# Patient Record
Sex: Female | Born: 1960 | Race: White | Hispanic: No | Marital: Married | Smoking: Former smoker
Health system: Southern US, Community
[De-identification: ages and names within clinical notes are randomized; demographics above are authoritative.]

## PROBLEM LIST (undated history)

## (undated) HISTORY — PX: BREAST CYST ASPIRATION: SHX578

---

## 2004-09-29 ENCOUNTER — Ambulatory Visit: Payer: Self-pay | Admitting: Family Medicine

## 2004-11-17 ENCOUNTER — Ambulatory Visit: Payer: Self-pay | Admitting: Internal Medicine

## 2004-11-30 ENCOUNTER — Ambulatory Visit: Payer: Self-pay | Admitting: Internal Medicine

## 2005-08-03 ENCOUNTER — Ambulatory Visit: Payer: Self-pay | Admitting: Unknown Physician Specialty

## 2006-06-22 ENCOUNTER — Ambulatory Visit: Payer: Self-pay | Admitting: Unknown Physician Specialty

## 2007-04-24 ENCOUNTER — Ambulatory Visit: Payer: Self-pay | Admitting: Family Medicine

## 2008-10-14 ENCOUNTER — Ambulatory Visit: Payer: Self-pay | Admitting: Family Medicine

## 2009-11-25 ENCOUNTER — Ambulatory Visit: Payer: Self-pay | Admitting: Family Medicine

## 2010-02-10 ENCOUNTER — Emergency Department: Payer: Self-pay | Admitting: Emergency Medicine

## 2011-12-06 ENCOUNTER — Ambulatory Visit: Payer: Self-pay | Admitting: Obstetrics & Gynecology

## 2013-09-23 ENCOUNTER — Emergency Department: Payer: Self-pay | Admitting: Emergency Medicine

## 2013-09-23 LAB — BASIC METABOLIC PANEL
BUN: 19 mg/dL — ABNORMAL HIGH (ref 7–18)
Calcium, Total: 9.8 mg/dL (ref 8.5–10.1)
Co2: 32 mmol/L (ref 21–32)
Creatinine: 0.84 mg/dL (ref 0.60–1.30)
EGFR (Non-African Amer.): >60
Osmolality: 282 (ref 275–301)
Sodium: 140 mmol/L (ref 136–145)

## 2013-09-23 LAB — TROPONIN I: Troponin-I: <0.02 ng/mL

## 2013-09-23 LAB — CBC
HCT: 40.2 % (ref 35.0–47.0)
Platelet: 227 10*3/uL (ref 150–440)
RDW: 12.9 % (ref 11.5–14.5)
WBC: 7 10*3/uL (ref 3.6–11.0)

## 2014-07-07 DIAGNOSIS — R002 Palpitations: Secondary | ICD-10-CM | POA: Insufficient documentation

## 2014-11-10 ENCOUNTER — Ambulatory Visit: Payer: Self-pay | Admitting: Physician Assistant

## 2014-11-24 ENCOUNTER — Ambulatory Visit: Payer: Self-pay | Admitting: Physician Assistant

## 2015-01-27 ENCOUNTER — Ambulatory Visit
Admit: 2015-01-27 | Disposition: A | Discharge status: Home / Self Care | Payer: Self-pay | Attending: General Practice | Admitting: General Practice

## 2015-03-02 DIAGNOSIS — E785 Hyperlipidemia, unspecified: Secondary | ICD-10-CM | POA: Insufficient documentation

## 2015-03-02 DIAGNOSIS — I10 Essential (primary) hypertension: Secondary | ICD-10-CM | POA: Insufficient documentation

## 2015-08-19 ENCOUNTER — Other Ambulatory Visit: Payer: Self-pay | Admitting: Physician Assistant

## 2015-09-18 ENCOUNTER — Other Ambulatory Visit: Payer: Self-pay | Admitting: Physician Assistant

## 2015-10-07 ENCOUNTER — Encounter: Payer: Self-pay | Admitting: Physician Assistant

## 2015-10-07 ENCOUNTER — Ambulatory Visit: Payer: Self-pay | Admitting: Physician Assistant

## 2015-10-07 VITALS — BP 120/70 | HR 89 | Temp 97.8°F

## 2015-10-07 DIAGNOSIS — J018 Other acute sinusitis: Secondary | ICD-10-CM

## 2015-10-07 MED ORDER — AMOXICILLIN 875 MG PO TABS: 875.0000 mg | ORAL_TABLET | Freq: Two times a day (BID) | ORAL | Status: DC

## 2015-10-07 MED ORDER — FLUCONAZOLE 150 MG PO TABS: ORAL_TABLET | ORAL | Status: DC

## 2015-10-07 NOTE — Progress Notes (Signed)
S: C/o runny nose and congestion for 3 days, no fever, chills, cp/sob, v/d; mucus is green and thick, cough is sporadic, c/o of facial and dental pain. Husband has same sx  Using otc meds:   O: PE: vitals wnl, nad,  perrl eomi, normocephalic, tms dull, nasal mucosa red and swollen, throat injected, neck supple no lymph, lungs c t a, cv rrr, neuro intact  A:  Acute sinusitis   P: amoxil 875mg  bid x 10d, diflucan, drink fluids, continue regular meds , use otc meds of choice, return if not improving in 5 days, return earlier if worsening

## 2015-10-15 ENCOUNTER — Other Ambulatory Visit: Payer: Self-pay | Admitting: Emergency Medicine

## 2015-10-15 MED ORDER — OLMESARTAN MEDOXOMIL 20 MG PO TABS: 20.0000 mg | ORAL_TABLET | Freq: Every day | ORAL | Status: DC

## 2015-10-15 NOTE — Telephone Encounter (Signed)
Received a faxed medication request from [facility].  Please advise.  Thank you. 

## 2015-10-15 NOTE — Telephone Encounter (Signed)
Med refill approved 

## 2015-12-18 ENCOUNTER — Other Ambulatory Visit: Payer: Self-pay | Admitting: Physician Assistant

## 2016-02-18 ENCOUNTER — Other Ambulatory Visit: Payer: Self-pay | Admitting: Family

## 2016-02-18 NOTE — Telephone Encounter (Signed)
Med refill approved x 1, pt needs yearly labs prior to next refill

## 2016-03-17 ENCOUNTER — Other Ambulatory Visit: Payer: Self-pay | Admitting: Physician Assistant

## 2016-03-21 NOTE — Telephone Encounter (Signed)
Med refill approved, pt needs to be seen for fasting labs, please call and let her know to come in

## 2016-03-25 ENCOUNTER — Other Ambulatory Visit: Payer: Self-pay

## 2016-03-25 DIAGNOSIS — Z299 Encounter for prophylactic measures, unspecified: Secondary | ICD-10-CM

## 2016-03-25 NOTE — Progress Notes (Signed)
[facility].  Patient was told to get labs done before she can have any additional refills for her medications by [facility].  Blood was drawn from the right arm without any incident.

## 2016-03-26 LAB — CMP12+LP+TP+TSH+6AC+CBC/D/PLT
A/G RATIO: 2 (ref 1.2–2.2)
ALT: 31 IU/L (ref 0–32)
AST: 10 IU/L (ref 0–40)
Albumin: 4.6 g/dL (ref 3.5–5.5)
Alkaline Phosphatase: 74 IU/L (ref 39–117)
BASOS ABS: 0 10*3/uL (ref 0.0–0.2)
BILIRUBIN TOTAL: 0.4 mg/dL (ref 0.0–1.2)
BUN/Creatinine Ratio: 26 — ABNORMAL HIGH (ref 9–23)
BUN: 17 mg/dL (ref 6–24)
Basos: 0 %
CALCIUM: 9.4 mg/dL (ref 8.7–10.2)
CHLORIDE: 100 mmol/L (ref 96–106)
CHOL/HDL RATIO: 3.5 ratio (ref 0.0–4.4)
CREATININE: 0.65 mg/dL (ref 0.57–1.00)
Cholesterol, Total: 223 mg/dL — ABNORMAL HIGH (ref 100–199)
EOS (ABSOLUTE): 0.1 10*3/uL (ref 0.0–0.4)
EOS: 2 %
Estimated CHD Risk: 0.6 times avg. (ref 0.0–1.0)
Free Thyroxine Index: 2.2 (ref 1.2–4.9)
GFR, EST AFRICAN AMERICAN: 116 mL/min/{1.73_m2} (ref >59)
GFR, EST NON AFRICAN AMERICAN: 100 mL/min/{1.73_m2} (ref >59)
GGT: 30 IU/L (ref 0–60)
GLUCOSE: 93 mg/dL (ref 65–99)
Globulin, Total: 2.3 g/dL (ref 1.5–4.5)
HDL: 64 mg/dL (ref >39)
HEMATOCRIT: 40.2 % (ref 34.0–46.6)
HEMOGLOBIN: 13 g/dL (ref 11.1–15.9)
Immature Grans (Abs): 0 10*3/uL (ref 0.0–0.1)
Immature Granulocytes: 0 %
Iron: 92 ug/dL (ref 27–159)
LDH: 152 IU/L (ref 119–226)
LDL CALC: 128 mg/dL — AB (ref 0–99)
Lymphocytes Absolute: 2.1 10*3/uL (ref 0.7–3.1)
Lymphs: 33 %
MCH: 29.8 pg (ref 26.6–33.0)
MCHC: 32.3 g/dL (ref 31.5–35.7)
MCV: 92 fL (ref 79–97)
MONOCYTES: 10 %
Monocytes Absolute: 0.6 10*3/uL (ref 0.1–0.9)
Neutrophils Absolute: 3.4 10*3/uL (ref 1.4–7.0)
Neutrophils: 55 %
PLATELETS: 246 10*3/uL (ref 150–379)
Phosphorus: 4.5 mg/dL (ref 2.5–4.5)
Potassium: 5.2 mmol/L (ref 3.5–5.2)
RBC: 4.36 x10E6/uL (ref 3.77–5.28)
RDW: 12.9 % (ref 12.3–15.4)
Sodium: 142 mmol/L (ref 134–144)
T3 Uptake Ratio: 27 % (ref 24–39)
T4, Total: 8.1 ug/dL (ref 4.5–12.0)
TSH: 1.58 u[IU]/mL (ref 0.450–4.500)
Total Protein: 6.9 g/dL (ref 6.0–8.5)
Triglycerides: 155 mg/dL — ABNORMAL HIGH (ref 0–149)
URIC ACID: 6.7 mg/dL (ref 2.5–7.1)
VLDL CHOLESTEROL CAL: 31 mg/dL (ref 5–40)
WBC: 6.3 10*3/uL (ref 3.4–10.8)

## 2016-03-26 LAB — VITAMIN D 25 HYDROXY (VIT D DEFICIENCY, FRACTURES): VIT D 25 HYDROXY: 34 ng/mL (ref 30.0–100.0)

## 2016-09-21 ENCOUNTER — Encounter: Payer: Self-pay | Admitting: Physician Assistant

## 2016-09-21 ENCOUNTER — Ambulatory Visit: Payer: Self-pay | Admitting: Physician Assistant

## 2016-09-21 VITALS — BP 140/80 | HR 100 | Temp 98.5°F

## 2016-09-21 DIAGNOSIS — F5102 Adjustment insomnia: Secondary | ICD-10-CM

## 2016-09-21 MED ORDER — TRAZODONE HCL 150 MG PO TABS: 150.0000 mg | ORAL_TABLET | Freq: Every evening | ORAL | 3 refills | Status: DC | PRN

## 2016-09-21 NOTE — Addendum Note (Signed)
Addended by: Catha BrowEACON, MONIQUE T on: 09/21/2016 02:09 PM   Modules accepted: Orders

## 2016-09-21 NOTE — Addendum Note (Signed)
Addended by: Catha BrowEACON, MONIQUE T on: 09/21/2016 02:03 PM   Modules accepted: Orders

## 2016-09-21 NOTE — Progress Notes (Signed)
S: c/o insomnia, recently lost her job and has had other stressors at home, never been a "good sleeper" but now isn't sleeping at all, some depression but not bad, no si/hi  O: vitals wnl, nad, pt appears tired, has circles around her eyes, lungs c t a, cv rrr  A: insomnia  P: trazadone 150mg  qhs

## 2016-09-26 ENCOUNTER — Other Ambulatory Visit: Payer: Self-pay | Admitting: Physician Assistant

## 2016-09-26 DIAGNOSIS — Z1231 Encounter for screening mammogram for malignant neoplasm of breast: Secondary | ICD-10-CM

## 2016-10-11 ENCOUNTER — Encounter: Payer: Self-pay | Admitting: Physician Assistant

## 2016-10-11 ENCOUNTER — Ambulatory Visit: Payer: Self-pay | Admitting: Physician Assistant

## 2016-10-11 VITALS — BP 166/90 | HR 105 | Temp 98.8°F

## 2016-10-11 DIAGNOSIS — J0100 Acute maxillary sinusitis, unspecified: Secondary | ICD-10-CM

## 2016-10-11 MED ORDER — FLUCONAZOLE 150 MG PO TABS: ORAL_TABLET | ORAL | 0 refills | Status: DC

## 2016-10-11 MED ORDER — AMOXICILLIN 875 MG PO TABS: 875.0000 mg | ORAL_TABLET | Freq: Two times a day (BID) | ORAL | 0 refills | Status: DC

## 2016-10-11 NOTE — Progress Notes (Signed)
S: C/o runny nose and congestion for 3 days, no fever, chills, cp/sob, v/d; mucus is green and thick, cough is sporadic, c/o of facial and dental pain.   Using otc meds:   O: PE: vitals wnl nad, perrl eomi, normocephalic, tms dull, nasal mucosa red and swollen, throat injected, neck supple no lymph, lungs c t a, cv rrr, neuro intact  A:  Acute sinusitis   P: drink fluids, continue regular meds , use otc meds of choice, return if not improving in 5 days, return earlier if worsening , amoxil, diflucan if needed

## 2016-10-27 ENCOUNTER — Ambulatory Visit: Payer: Self-pay | Admitting: Physician Assistant

## 2016-10-27 ENCOUNTER — Encounter: Payer: Self-pay | Admitting: Physician Assistant

## 2016-10-27 VITALS — BP 139/70 | HR 91 | Temp 98.6°F

## 2016-10-27 DIAGNOSIS — J0100 Acute maxillary sinusitis, unspecified: Secondary | ICD-10-CM

## 2016-10-27 MED ORDER — CEFDINIR 300 MG PO CAPS: 300.0000 mg | ORAL_CAPSULE | Freq: Two times a day (BID) | ORAL | 0 refills | Status: DC

## 2016-10-27 NOTE — Progress Notes (Signed)
c 

## 2016-10-27 NOTE — Progress Notes (Signed)
S: C/o runny nose and congestion for 3-5 days, no fever, chills, cp/sob, v/d; mucus is green and thick, cough is sporadic, c/o of facial and dental pain. Only took 1/2 of the amoxil and thinks the sx are getting worse  Using otc meds:   O: PE: vitals wnl, nad, perrl eomi, normocephalic, tms dull, nasal mucosa red and swollen, throat injected, neck supple no lymph, lungs c t a, cv rrr, neuro intact  A:  Acute sinusitis   P: drink fluids, continue regular meds , use otc meds of choice, return if not improving in 5 days, return earlier if worsening , omnicef

## 2016-11-01 ENCOUNTER — Encounter: Payer: Self-pay | Admitting: Radiology

## 2016-11-01 ENCOUNTER — Ambulatory Visit
Admission: RE | Admit: 2016-11-01 | Discharge: 2016-11-01 | Disposition: A | Discharge status: Home / Self Care | Payer: Managed Care, Other (non HMO) | Source: Ambulatory Visit | Attending: Physician Assistant | Admitting: Physician Assistant

## 2016-11-01 DIAGNOSIS — Z1231 Encounter for screening mammogram for malignant neoplasm of breast: Secondary | ICD-10-CM | POA: Insufficient documentation

## 2016-11-01 DIAGNOSIS — R928 Other abnormal and inconclusive findings on diagnostic imaging of breast: Secondary | ICD-10-CM | POA: Insufficient documentation

## 2016-11-04 ENCOUNTER — Other Ambulatory Visit: Payer: Self-pay | Admitting: Physician Assistant

## 2016-11-04 DIAGNOSIS — R928 Other abnormal and inconclusive findings on diagnostic imaging of breast: Secondary | ICD-10-CM

## 2016-11-08 ENCOUNTER — Other Ambulatory Visit: Payer: Self-pay | Admitting: Physician Assistant

## 2016-11-09 ENCOUNTER — Other Ambulatory Visit: Payer: Self-pay | Admitting: Physician Assistant

## 2016-11-09 NOTE — Telephone Encounter (Signed)
Med refill for synthroid approved 

## 2016-11-09 NOTE — Telephone Encounter (Signed)
Med refill for benicar approved 

## 2016-11-15 ENCOUNTER — Ambulatory Visit
Admission: RE | Admit: 2016-11-15 | Discharge: 2016-11-15 | Disposition: A | Discharge status: Home / Self Care | Payer: Managed Care, Other (non HMO) | Source: Ambulatory Visit | Attending: Physician Assistant | Admitting: Physician Assistant

## 2016-11-15 DIAGNOSIS — N6320 Unspecified lump in the left breast, unspecified quadrant: Secondary | ICD-10-CM | POA: Insufficient documentation

## 2016-11-15 DIAGNOSIS — R928 Other abnormal and inconclusive findings on diagnostic imaging of breast: Secondary | ICD-10-CM

## 2016-11-16 ENCOUNTER — Other Ambulatory Visit: Payer: Self-pay | Admitting: Physician Assistant

## 2016-11-16 DIAGNOSIS — R928 Other abnormal and inconclusive findings on diagnostic imaging of breast: Secondary | ICD-10-CM

## 2016-11-16 DIAGNOSIS — N6320 Unspecified lump in the left breast, unspecified quadrant: Secondary | ICD-10-CM

## 2016-11-29 ENCOUNTER — Ambulatory Visit
Admission: RE | Admit: 2016-11-29 | Discharge: 2016-11-29 | Disposition: A | Discharge status: Home / Self Care | Payer: Managed Care, Other (non HMO) | Source: Ambulatory Visit | Attending: Physician Assistant | Admitting: Physician Assistant

## 2016-11-29 DIAGNOSIS — N6320 Unspecified lump in the left breast, unspecified quadrant: Secondary | ICD-10-CM | POA: Diagnosis present

## 2016-11-29 DIAGNOSIS — N6032 Fibrosclerosis of left breast: Secondary | ICD-10-CM | POA: Insufficient documentation

## 2016-11-29 DIAGNOSIS — R928 Other abnormal and inconclusive findings on diagnostic imaging of breast: Secondary | ICD-10-CM

## 2016-11-29 DIAGNOSIS — N6322 Unspecified lump in the left breast, upper inner quadrant: Secondary | ICD-10-CM | POA: Insufficient documentation

## 2016-11-29 HISTORY — PX: BREAST BIOPSY: SHX20

## 2016-11-30 LAB — SURGICAL PATHOLOGY

## 2017-02-07 ENCOUNTER — Other Ambulatory Visit: Payer: Self-pay | Admitting: Physician Assistant

## 2017-02-07 NOTE — Telephone Encounter (Signed)
Med refill for trazadone approved, using for insomnia assoc with anxiety due to recent job loss

## 2017-03-01 ENCOUNTER — Ambulatory Visit: Payer: Self-pay | Admitting: Physician Assistant

## 2017-03-01 ENCOUNTER — Encounter: Payer: Self-pay | Admitting: Physician Assistant

## 2017-03-01 VITALS — BP 100/70 | HR 80 | Temp 98.5°F

## 2017-03-01 DIAGNOSIS — J069 Acute upper respiratory infection, unspecified: Secondary | ICD-10-CM

## 2017-03-01 MED ORDER — FLUCONAZOLE 150 MG PO TABS: ORAL_TABLET | ORAL | 0 refills | Status: DC

## 2017-03-01 MED ORDER — AMOXICILLIN 875 MG PO TABS: 875.0000 mg | ORAL_TABLET | Freq: Two times a day (BID) | ORAL | 0 refills | Status: DC

## 2017-03-01 NOTE — Progress Notes (Signed)
S: C/o cough and  congestion for 14 days, no fever, chills, cp/sob, v/d; mucus was green this am but clear throughout the day, cough is sporadic, chest is rattling and feels tight  Using otc meds: mucinex, used 2 boxes  O: PE: vitals wnl, nad, perrl eomi, normocephalic, tms dull, nasal mucosa red and swollen, throat injected, neck supple no lymph, lungs c t a, cv rrr, neuro intact  A:  Acute uri   P: drink fluids, continue regular meds , use otc meds of choice, return if not improving in 5 days, return earlier if worsening , amoxil, diflucan

## 2017-05-01 ENCOUNTER — Ambulatory Visit: Payer: Self-pay | Admitting: Physician Assistant

## 2017-05-01 VITALS — BP 125/70 | HR 93 | Temp 98.5°F | Resp 16

## 2017-05-01 DIAGNOSIS — R05 Cough: Secondary | ICD-10-CM

## 2017-05-01 DIAGNOSIS — R059 Cough, unspecified: Secondary | ICD-10-CM

## 2017-05-01 NOTE — Progress Notes (Signed)
Pt needed to leave to get back to work, will make appt for later this week

## 2017-05-08 ENCOUNTER — Other Ambulatory Visit: Payer: Self-pay | Admitting: Physician Assistant

## 2017-05-17 ENCOUNTER — Ambulatory Visit: Payer: Self-pay | Admitting: Physician Assistant

## 2017-05-17 VITALS — BP 120/70 | HR 78 | Temp 98.5°F | Resp 16

## 2017-05-17 DIAGNOSIS — R5383 Other fatigue: Secondary | ICD-10-CM

## 2017-05-17 NOTE — Progress Notes (Signed)
S:  Fatigue x 2 months.  Has been taking synthroid for 11 years at the same dose.  No other changes.  Denies night sweats.  + difficulty getting to sleep.  O:  Heart RRR, Lung clr bilat, A:  Fatigue P:  Labs drawn

## 2017-05-17 NOTE — Addendum Note (Signed)
Addended by: Catha BrowEACON, MONIQUE T on: 05/17/2017 11:03 AM   Modules accepted: Orders

## 2017-05-18 LAB — CMP12+LP+TP+TSH+6AC+CBC/D/PLT
ALT: 23 IU/L (ref 0–32)
AST: 8 IU/L (ref 0–40)
Albumin/Globulin Ratio: 2.4 — ABNORMAL HIGH (ref 1.2–2.2)
Albumin: 4.6 g/dL (ref 3.5–5.5)
Alkaline Phosphatase: 74 IU/L (ref 39–117)
BASOS ABS: 0 10*3/uL (ref 0.0–0.2)
BASOS: 1 %
BUN / CREAT RATIO: 19 (ref 9–23)
BUN: 15 mg/dL (ref 6–24)
Bilirubin Total: 0.4 mg/dL (ref 0.0–1.2)
CHLORIDE: 100 mmol/L (ref 96–106)
Calcium: 9.6 mg/dL (ref 8.7–10.2)
Chol/HDL Ratio: 3.8 ratio (ref 0.0–4.4)
Cholesterol, Total: 203 mg/dL — ABNORMAL HIGH (ref 100–199)
Creatinine, Ser: 0.8 mg/dL (ref 0.57–1.00)
EOS (ABSOLUTE): 0.1 10*3/uL (ref 0.0–0.4)
EOS: 2 %
ESTIMATED CHD RISK: 0.8 times avg. (ref 0.0–1.0)
Free Thyroxine Index: 2 (ref 1.2–4.9)
GFR calc Af Amer: 95 mL/min/{1.73_m2} (ref >59)
GFR calc non Af Amer: 83 mL/min/{1.73_m2} (ref >59)
GGT: 29 IU/L (ref 0–60)
GLUCOSE: 94 mg/dL (ref 65–99)
Globulin, Total: 1.9 g/dL (ref 1.5–4.5)
HDL: 53 mg/dL (ref >39)
HEMOGLOBIN: 12.9 g/dL (ref 11.1–15.9)
Hematocrit: 39.6 % (ref 34.0–46.6)
IRON: 87 ug/dL (ref 27–159)
Immature Grans (Abs): 0 10*3/uL (ref 0.0–0.1)
Immature Granulocytes: 0 %
LDH: 172 IU/L (ref 119–226)
LDL Calculated: 117 mg/dL — ABNORMAL HIGH (ref 0–99)
LYMPHS ABS: 2.1 10*3/uL (ref 0.7–3.1)
Lymphs: 32 %
MCH: 29.7 pg (ref 26.6–33.0)
MCHC: 32.6 g/dL (ref 31.5–35.7)
MCV: 91 fL (ref 79–97)
Monocytes Absolute: 0.5 10*3/uL (ref 0.1–0.9)
Monocytes: 8 %
Neutrophils Absolute: 3.7 10*3/uL (ref 1.4–7.0)
Neutrophils: 57 %
PHOSPHORUS: 4.3 mg/dL (ref 2.5–4.5)
PLATELETS: 275 10*3/uL (ref 150–379)
POTASSIUM: 4.6 mmol/L (ref 3.5–5.2)
RBC: 4.35 x10E6/uL (ref 3.77–5.28)
RDW: 13.5 % (ref 12.3–15.4)
SODIUM: 141 mmol/L (ref 134–144)
T3 UPTAKE RATIO: 24 % (ref 24–39)
T4, Total: 8.5 ug/dL (ref 4.5–12.0)
TOTAL PROTEIN: 6.5 g/dL (ref 6.0–8.5)
TSH: 3.86 u[IU]/mL (ref 0.450–4.500)
Triglycerides: 164 mg/dL — ABNORMAL HIGH (ref 0–149)
URIC ACID: 5.6 mg/dL (ref 2.5–7.1)
VLDL CHOLESTEROL CAL: 33 mg/dL (ref 5–40)
WBC: 6.5 10*3/uL (ref 3.4–10.8)

## 2017-05-18 LAB — VITAMIN D 25 HYDROXY (VIT D DEFICIENCY, FRACTURES): Vit D, 25-Hydroxy: 33 ng/mL (ref 30.0–100.0)

## 2017-05-18 LAB — VITAMIN B12: Vitamin B-12: 1215 pg/mL (ref 232–1245)

## 2017-06-28 ENCOUNTER — Ambulatory Visit: Payer: Self-pay | Admitting: Physician Assistant

## 2017-06-28 ENCOUNTER — Encounter: Payer: Self-pay | Admitting: Physician Assistant

## 2017-06-28 VITALS — BP 110/70 | HR 83 | Temp 98.5°F | Resp 16

## 2017-06-28 DIAGNOSIS — N63 Unspecified lump in unspecified breast: Secondary | ICD-10-CM

## 2017-06-28 DIAGNOSIS — R05 Cough: Secondary | ICD-10-CM

## 2017-06-28 DIAGNOSIS — R059 Cough, unspecified: Secondary | ICD-10-CM

## 2017-06-28 MED ORDER — LEVOCETIRIZINE DIHYDROCHLORIDE 5 MG PO TABS: 5.0000 mg | ORAL_TABLET | Freq: Every evening | ORAL | 12 refills | Status: DC

## 2017-06-28 MED ORDER — OMEPRAZOLE 20 MG PO CPDR: 20.0000 mg | DELAYED_RELEASE_CAPSULE | Freq: Every day | ORAL | 3 refills | Status: DC

## 2017-06-28 NOTE — Progress Notes (Signed)
S: c/o cough for over a year, ?if from bp meds as she started taking the generic, no fever/chills/or sputum production, denies cp/sob, states the cough is worse at night when she lies down, does have some allergies, the cough keeps waking her up at night, also needs order for mammogram to recheck left breast  O: vitals wnl, nad, ent wnl, neck supple no lymph, lungs c t a, cv rrr  A: chronic cough, ?gerd, breast mass   P: cxr, mammogram left breast, omeprazole, xyzal

## 2017-06-29 ENCOUNTER — Ambulatory Visit
Admission: RE | Admit: 2017-06-29 | Discharge: 2017-06-29 | Disposition: A | Discharge status: Home / Self Care | Payer: Managed Care, Other (non HMO) | Source: Ambulatory Visit | Attending: Physician Assistant | Admitting: Physician Assistant

## 2017-06-29 DIAGNOSIS — R05 Cough: Secondary | ICD-10-CM | POA: Insufficient documentation

## 2017-06-29 DIAGNOSIS — R059 Cough, unspecified: Secondary | ICD-10-CM

## 2017-07-06 NOTE — Addendum Note (Signed)
Addended by: Catha Brow T on: 07/06/2017 04:14 PM   Modules accepted: Orders

## 2017-10-12 ENCOUNTER — Ambulatory Visit
Admission: RE | Admit: 2017-10-12 | Discharge: 2017-10-12 | Disposition: A | Discharge status: Home / Self Care | Payer: Managed Care, Other (non HMO) | Source: Ambulatory Visit | Attending: Physician Assistant | Admitting: Physician Assistant

## 2017-10-12 DIAGNOSIS — N63 Unspecified lump in unspecified breast: Secondary | ICD-10-CM

## 2017-10-12 DIAGNOSIS — N6321 Unspecified lump in the left breast, upper outer quadrant: Secondary | ICD-10-CM | POA: Insufficient documentation

## 2017-10-12 DIAGNOSIS — N6489 Other specified disorders of breast: Secondary | ICD-10-CM | POA: Diagnosis not present

## 2017-10-12 DIAGNOSIS — N6320 Unspecified lump in the left breast, unspecified quadrant: Secondary | ICD-10-CM | POA: Diagnosis present

## 2017-10-18 ENCOUNTER — Ambulatory Visit: Payer: Self-pay | Admitting: Emergency Medicine

## 2017-10-18 VITALS — BP 120/80 | HR 94 | Temp 97.7°F | Resp 16

## 2017-10-18 DIAGNOSIS — R05 Cough: Secondary | ICD-10-CM

## 2017-10-18 DIAGNOSIS — R059 Cough, unspecified: Secondary | ICD-10-CM

## 2017-10-18 DIAGNOSIS — J0100 Acute maxillary sinusitis, unspecified: Secondary | ICD-10-CM

## 2017-10-18 MED ORDER — FLUTICASONE PROPIONATE 50 MCG/ACT NA SUSP: 2.0000 | Freq: Every day | NASAL | 6 refills | Status: DC

## 2017-10-18 MED ORDER — FLUCONAZOLE 150 MG PO TABS: 150.0000 mg | ORAL_TABLET | Freq: Once | ORAL | 0 refills | Status: AC

## 2017-10-18 MED ORDER — BENZONATATE 100 MG PO CAPS: 100.0000 mg | ORAL_CAPSULE | Freq: Three times a day (TID) | ORAL | 0 refills | Status: DC | PRN

## 2017-10-18 MED ORDER — AMOXICILLIN 875 MG PO TABS: 875.0000 mg | ORAL_TABLET | Freq: Two times a day (BID) | ORAL | 0 refills | Status: DC

## 2017-10-18 NOTE — Progress Notes (Signed)
Subjective. Patient presents with onset 8 days ago of head congestion sore throat. This was followed by a cough. [facility]. She has had a persistent cough. Her main problem is facial pressure associated with a thick discolored nasal drainage. She feels full in her sinus area. She has not had fevers chills or myalgias. Objective. Alert and cooperative not in any distress. TMs are normal without fluid. Nose exam reveals significant nasal congestion. Throat is red without exudate. Chest is clear to auscultation Assessment  Patient seen with upper respiratory infection with secondary maxillary sinusitis. Plan #1 amoxicillin 875 twice a day. #2 Tessalon Perles 100 mg 1-23 times a day. #3 Flonase nasal spray. #4 Diflucan 150 one dose.

## 2018-02-09 ENCOUNTER — Other Ambulatory Visit: Payer: Self-pay | Admitting: Family Medicine

## 2018-02-09 DIAGNOSIS — I10 Essential (primary) hypertension: Secondary | ICD-10-CM

## 2018-02-09 DIAGNOSIS — K219 Gastro-esophageal reflux disease without esophagitis: Secondary | ICD-10-CM

## 2018-02-09 DIAGNOSIS — E039 Hypothyroidism, unspecified: Secondary | ICD-10-CM

## 2018-02-09 MED ORDER — OMEPRAZOLE 20 MG PO CPDR: 20.0000 mg | DELAYED_RELEASE_CAPSULE | Freq: Every day | ORAL | 0 refills | Status: AC

## 2018-02-09 MED ORDER — SYNTHROID 112 MCG PO TABS: 112.0000 ug | ORAL_TABLET | Freq: Every day | ORAL | 0 refills | Status: AC

## 2018-02-09 MED ORDER — OLMESARTAN MEDOXOMIL 20 MG PO TABS: ORAL_TABLET | ORAL | 0 refills | Status: AC

## 2018-02-09 NOTE — Progress Notes (Signed)
Patient called requesting a refill for olmesartan, omeprazole, and levothyroxine.  Patient was previously getting refills from our clinic for these.  Patient was advised that I would provide her with one refill for these medications to provide her with  enough next time to establish care with a primary care provider.  Patient was advised that her primary care provider would be responsible for monitoring and refills in the future.

## 2018-02-15 ENCOUNTER — Ambulatory Visit: Payer: Self-pay

## 2018-02-19 DIAGNOSIS — E039 Hypothyroidism, unspecified: Secondary | ICD-10-CM | POA: Insufficient documentation

## 2018-04-02 IMAGING — US US BREAST*L* LIMITED INC AXILLA
1 series · 7 of 7 positions shown · non-contrast
Comparison: Previous exam(s).

CLINICAL DATA: Patient recalled from screening for possible left
breast distortion.

EXAM:
DIGITAL DIAGNOSTIC LEFT MAMMOGRAM WITH CAD
ULTRASOUND LEFT BREAST

[Series 1: us breast*left* limited inc axilla · 0.06mm/px · 7 of 7 slices shown]
[im 1/7]
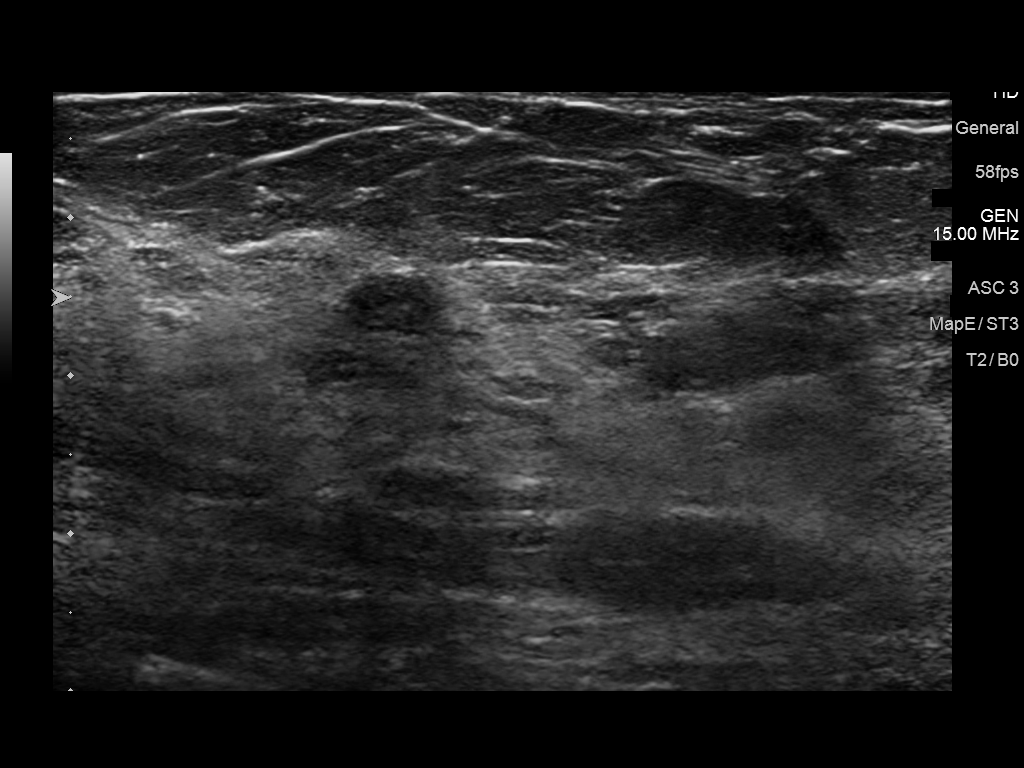
[im 2/7]
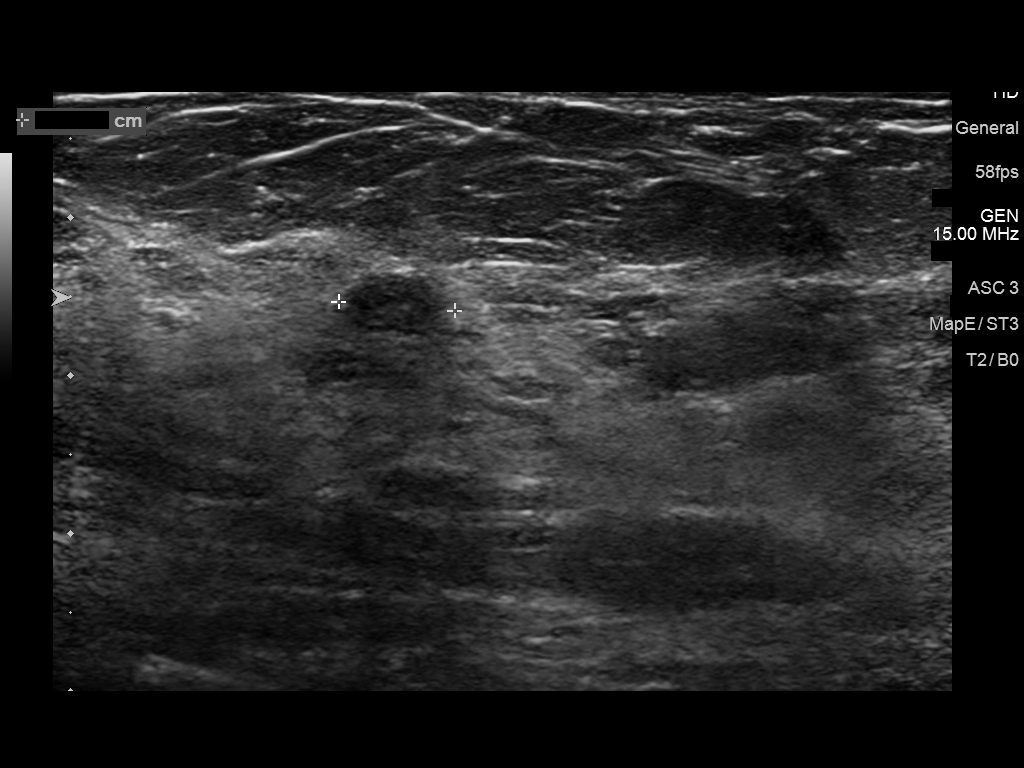
[im 3/7]
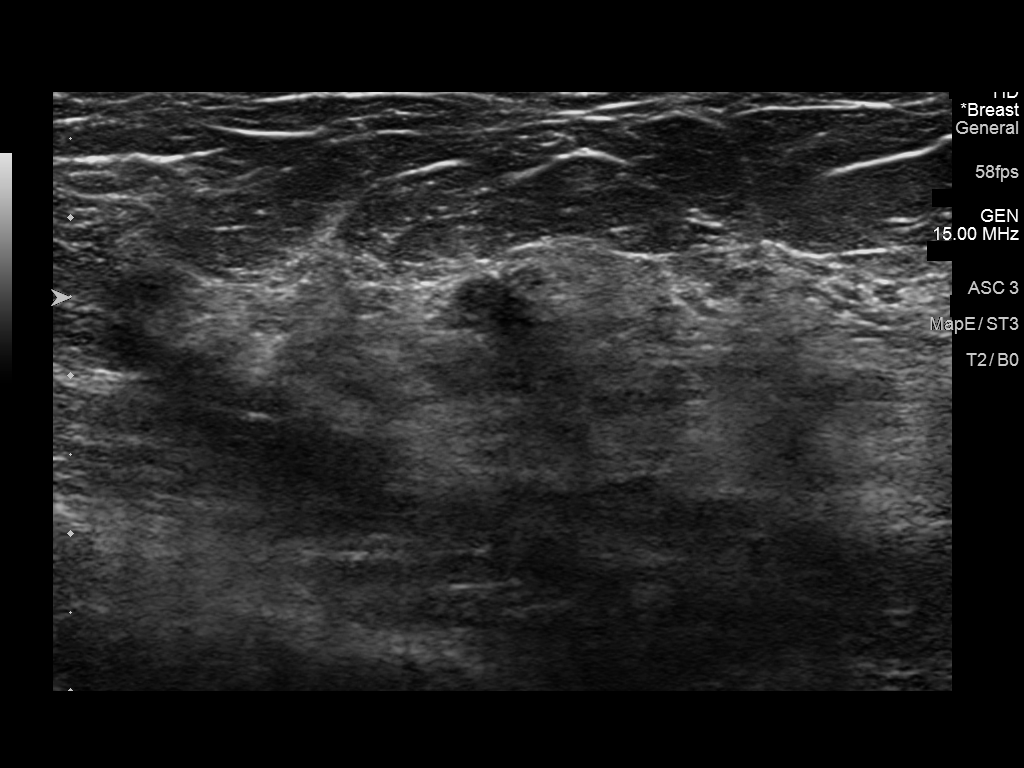
[im 4/7]
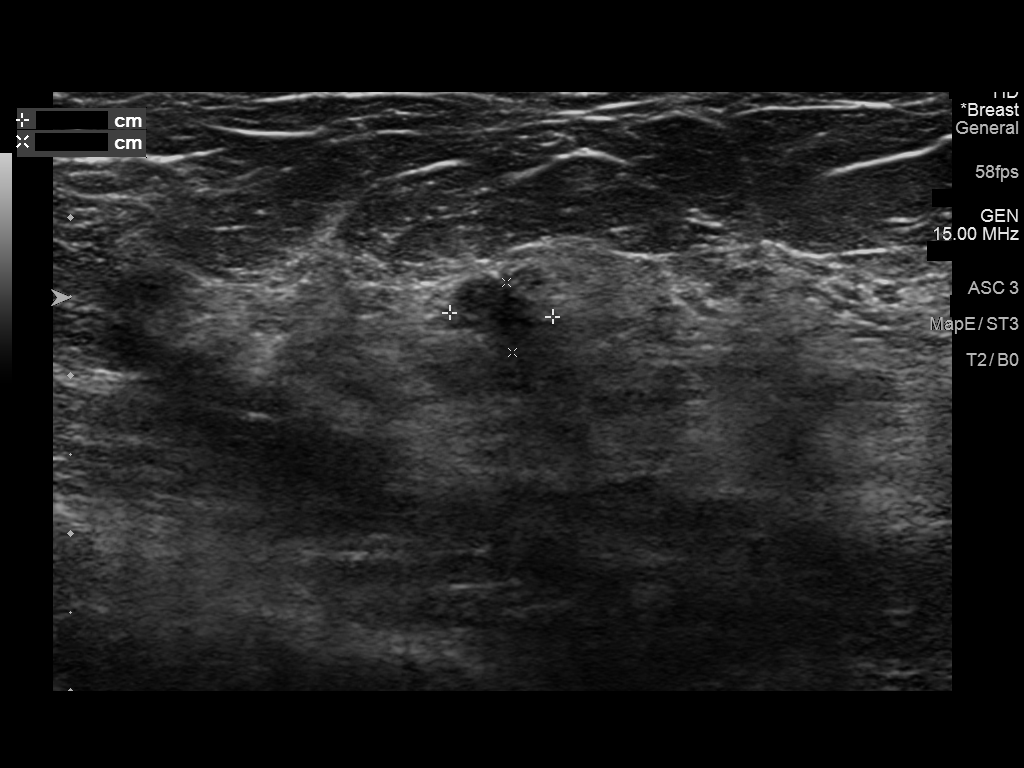
[im 5/7]
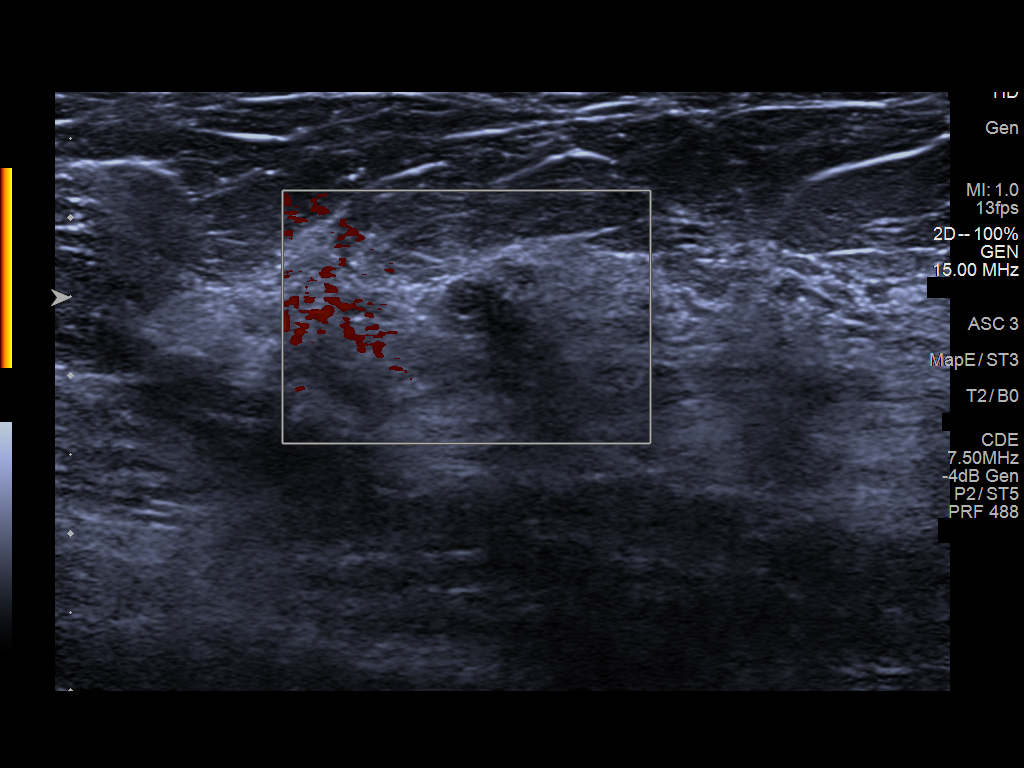
[im 6/7]
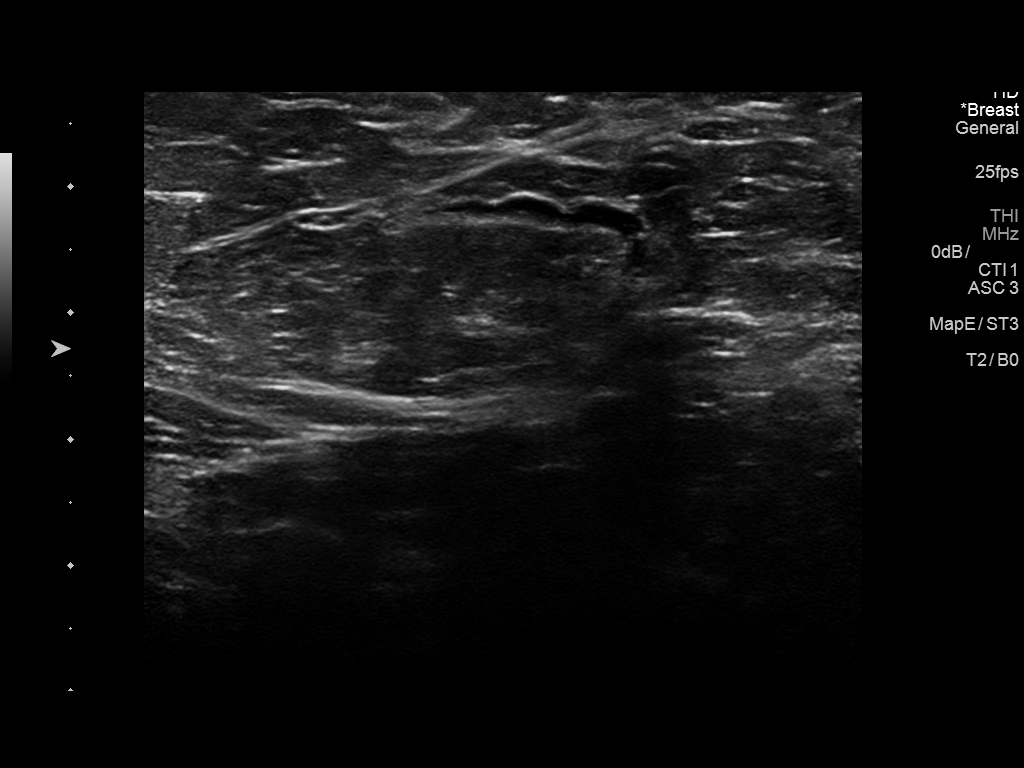
[im 7/7]
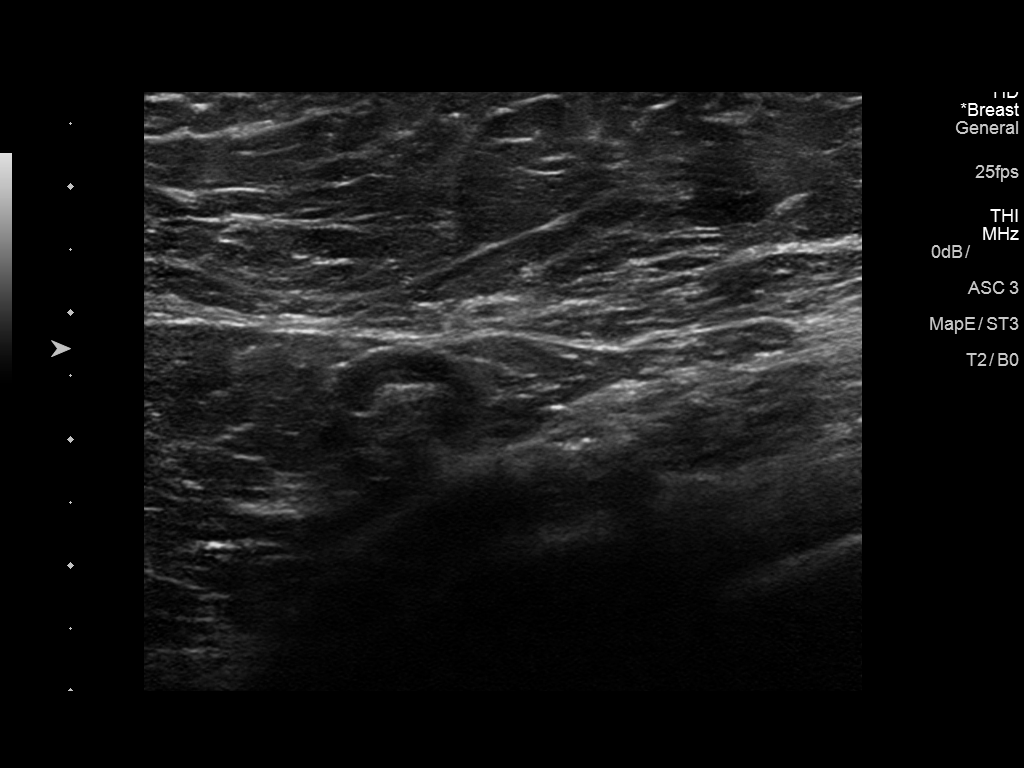

[7 of 7 positions shown; findings below may reference images not displayed]

ACR Breast Density Category c: The breast tissue is heterogeneously
dense, which may obscure small masses.
FINDINGS: Cc and MLO spot tomosynthesis images of the left breast were
obtained. Questioned distortion within the medial left breast
appeared to predominately resolve however there is significant
residual dense tissue located within the medial left breast.

Mammographic images were processed with CAD.

On physical exam, I palpate no discrete mass within the medial left
breast.

Targeted ultrasound is performed, showing a 7 x 4 x 7 mm irregular
hypoechoic mass left breast 10 o'clock position 7 cm from nipple. No
left axillary adenopathy.
IMPRESSION: Irregular hypoechoic mass within the medial aspect of the left
breast.

RECOMMENDATION:
Ultrasound-guided core needle biopsy hypoechoic left breast mass.

I have discussed the findings and recommendations with the patient.
Results were also provided in writing at the conclusion of the
visit. If applicable, a reminder letter will be sent to the patient
regarding the next appointment.

BI-RADS CATEGORY  4: Suspicious.

## 2019-01-18 ENCOUNTER — Other Ambulatory Visit: Payer: Self-pay

## 2019-01-18 ENCOUNTER — Encounter: Payer: Self-pay | Admitting: Adult Health

## 2019-01-18 ENCOUNTER — Ambulatory Visit: Payer: Managed Care, Other (non HMO) | Admitting: Adult Health

## 2019-01-18 DIAGNOSIS — Z8711 Personal history of peptic ulcer disease: Secondary | ICD-10-CM | POA: Insufficient documentation

## 2019-01-18 DIAGNOSIS — I472 Ventricular tachycardia, unspecified: Secondary | ICD-10-CM | POA: Insufficient documentation

## 2019-01-18 DIAGNOSIS — J302 Other seasonal allergic rhinitis: Secondary | ICD-10-CM

## 2019-01-18 DIAGNOSIS — Z8639 Personal history of other endocrine, nutritional and metabolic disease: Secondary | ICD-10-CM | POA: Insufficient documentation

## 2019-01-18 DIAGNOSIS — R51 Headache: Secondary | ICD-10-CM

## 2019-01-18 DIAGNOSIS — J019 Acute sinusitis, unspecified: Secondary | ICD-10-CM | POA: Diagnosis not present

## 2019-01-18 DIAGNOSIS — R519 Headache, unspecified: Secondary | ICD-10-CM | POA: Insufficient documentation

## 2019-01-18 DIAGNOSIS — Z8719 Personal history of other diseases of the digestive system: Secondary | ICD-10-CM | POA: Insufficient documentation

## 2019-01-18 MED ORDER — FLUTICASONE PROPIONATE 50 MCG/ACT NA SUSP: 2.0000 | Freq: Every day | NASAL | 6 refills | Status: AC

## 2019-01-18 MED ORDER — FLUCONAZOLE 150 MG PO TABS: 150.0000 mg | ORAL_TABLET | Freq: Once | ORAL | 0 refills | Status: AC

## 2019-01-18 MED ORDER — AMOXICILLIN-POT CLAVULANATE 875-125 MG PO TABS: 1.0000 | ORAL_TABLET | Freq: Two times a day (BID) | ORAL | 0 refills | Status: AC

## 2019-01-18 MED ORDER — BENZONATATE 100 MG PO CAPS: 100.0000 mg | ORAL_CAPSULE | Freq: Three times a day (TID) | ORAL | 0 refills | Status: AC | PRN

## 2019-01-18 NOTE — Patient Instructions (Signed)
Cough, Adult  A cough helps to clear your throat and lungs. A cough may last only 2-3 weeks (acute), or it may last longer than 8 weeks (chronic). Many different things can cause a cough. A cough may be a sign of an illness or another medical condition. Follow these instructions at home:  Pay attention to any changes in your cough.  Take medicines only as told by your doctor. ? If you were prescribed an antibiotic medicine, take it as told by your doctor. Do not stop taking it even if you start to feel better. ? Talk with your doctor before you try using a cough medicine.  Drink enough fluid to keep your pee (urine) clear or pale yellow.  If the air is dry, use a cold steam vaporizer or humidifier in your home.  Stay away from things that make you cough at work or at home.  If your cough is worse at night, try using extra pillows to raise your head up higher while you sleep.  Do not smoke, and try not to be around smoke. If you need help quitting, ask your doctor.  Do not have caffeine.  Do not drink alcohol.  Rest as needed. Contact a doctor if:  You have new problems (symptoms).  You cough up yellow fluid (pus).  Your cough does not get better after 2-3 weeks, or your cough gets worse.  Medicine does not help your cough and you are not sleeping well.  You have pain that gets worse or pain that is not helped with medicine.  You have a fever.  You are losing weight and you do not know why.  You have night sweats. Get help right away if:  You cough up blood.  You have trouble breathing.  Your heartbeat is very fast. This information is not intended to replace advice given to you by your health care provider. Make sure you discuss any questions you have with your health care provider. Document Released: 06/16/2011 Document Revised: 03/10/2016 Document Reviewed: 12/10/2014 Elsevier Interactive Patient Education  2019 [employer] Sinusitis, Adult Sinusitis is  soreness and swelling (inflammation) of your sinuses. Sinuses are hollow spaces in the bones around your face. They are located:  Around your eyes.  In the middle of your forehead.  Behind your nose.  In your cheekbones. Your sinuses and nasal passages are lined with a fluid called mucus. Mucus drains out of your sinuses. Swelling can trap mucus in your sinuses. This lets germs (bacteria, virus, or fungus) grow, which leads to infection. Most of the time, this condition is caused by a virus. What are the causes? This condition is caused by:  Allergies.  Asthma.  Germs.  Things that block your nose or sinuses.  Growths in the nose (nasal polyps).  Chemicals or irritants in the air.  Fungus (rare). What increases the risk? You are more likely to develop this condition if:  You have a weak body defense system (immune system).  You do a lot of swimming or diving.  You use nasal sprays too much.  You smoke. What are the signs or symptoms? The main symptoms of this condition are pain and a feeling of pressure around the sinuses. Other symptoms include:  Stuffy nose (congestion).  Runny nose (drainage).  Swelling and warmth in the sinuses.  Headache.  Toothache.  A cough that may get worse at night.  Mucus that collects in the throat or the back of the nose (postnasal drip).  Being  unable to smell and taste.  Being very tired (fatigue).  A fever.  Sore throat.  Bad breath. How is this diagnosed? This condition is diagnosed based on:  Your symptoms.  Your medical history.  A physical exam.  Tests to find out if your condition is short-term (acute) or long-term (chronic). Your doctor may: ? Check your nose for growths (polyps). ? Check your sinuses using a tool that has a light (endoscope). ? Check for allergies or germs. ? Do imaging tests, such as an MRI or CT scan. How is this treated? Treatment for this condition depends on the cause and whether  it is short-term or long-term.  If caused by a virus, your symptoms should go away on their own within 10 days. You may be given medicines to relieve symptoms. They include: ? Medicines that shrink swollen tissue in the nose. ? Medicines that treat allergies (antihistamines). ? A spray that treats swelling of the nostrils. ? Rinses that help get rid of thick mucus in your nose (nasal saline washes).  If caused by bacteria, your doctor may wait to see if you will get better without treatment. You may be given antibiotic medicine if you have: ? A very bad infection. ? A weak body defense system.  If caused by growths in the nose, you may need to have surgery. Follow these instructions at home: Medicines  Take, use, or apply over-the-counter and prescription medicines only as told by your doctor. These may include nasal sprays.  If you were prescribed an antibiotic medicine, take it as told by your doctor. Do not stop taking the antibiotic even if you start to feel better. Hydrate and humidify   Drink enough water to keep your pee (urine) pale yellow.  Use a cool mist humidifier to keep the humidity level in your home above 50%.  Breathe in steam for 10-15 minutes, 3-4 times a day, or as told by your doctor. You can do this in the bathroom while a hot shower is running.  Try not to spend time in cool or dry air. Rest  Rest as much as you can.  Sleep with your head raised (elevated).  Make sure you get enough sleep each night. General instructions   Put a warm, moist washcloth on your face 3-4 times a day, or as often as told by your doctor. This will help with discomfort.  Wash your hands often with soap and water. If there is no soap and water, use hand sanitizer.  Do not smoke. Avoid being around people who are smoking (secondhand smoke).  Keep all follow-up visits as told by your doctor. This is important. Contact a doctor if:  You have a fever.  Your symptoms get  worse.  Your symptoms do not get better within 10 days. Get help right away if:  You have a very bad headache.  You cannot stop throwing up (vomiting).  You have very bad pain or swelling around your face or eyes.  You have trouble seeing.  You feel confused.  Your neck is stiff.  You have trouble breathing. Summary  Sinusitis is swelling of your sinuses. Sinuses are hollow spaces in the bones around your face.  This condition is caused by tissues in your nose that become inflamed or swollen. This traps germs. These can lead to infection.  If you were prescribed an antibiotic medicine, take it as told by your doctor. Do not stop taking it even if you start to feel better.  Keep all follow-up visits as told by your doctor. This is important. This information is not intended to replace advice given to you by your health care provider. Make sure you discuss any questions you have with your health care provider. Document Released: 03/21/2008 Document Revised: 03/05/2018 Document Reviewed: 03/05/2018 Elsevier Interactive Patient Education  2019 [employer]  Benzonatate capsules What is this medicine? BENZONATATE (ben ZOE na tate) is used to treat cough. This medicine may be used for other purposes; ask your health care provider or pharmacist if you have questions. COMMON BRAND NAME(S): Tessalon Perles, Zonatuss What should I tell my health care provider before I take this medicine? They need to know if you have any of these conditions: -kidney or liver disease -an unusual or allergic reaction to benzonatate, anesthetics, other medicines, foods, dyes, or preservatives -pregnant or trying to get pregnant -breast-feeding How should I use this medicine? Take this medicine by mouth with a glass of water. Follow the directions on the prescription label. Avoid breaking, chewing, or sucking the capsule, as this can cause serious side effects. Take your medicine at regular intervals.  Do not take your medicine more often than directed. Talk to your pediatrician regarding the use of this medicine in children. While this drug may be prescribed for children as young as 34 years old for selected conditions, precautions do apply. Overdosage: If you think you have taken too much of this medicine contact a poison control center or emergency room at once. NOTE: This medicine is only for you. Do not share this medicine with others. What if I miss a dose? If you miss a dose, take it as soon as you can. If it is almost time for your next dose, take only that dose. Do not take double or extra doses. What may interact with this medicine? Do not take this medicine with any of the following medications: -MAOIs like Carbex, Eldepryl, Marplan, Nardil, and Parnate This list may not describe all possible interactions. Give your health care provider a list of all the medicines, herbs, non-prescription drugs, or dietary supplements you use. Also tell them if you smoke, drink alcohol, or use illegal drugs. Some items may interact with your medicine. What should I watch for while using this medicine? Tell your doctor if your symptoms do not improve or if they get worse. If you have a high fever, skin rash, or headache, see your health care professional. You may get drowsy or dizzy. Do not drive, use machinery, or do anything that needs mental alertness until you know how this medicine affects you. Do not sit or stand up quickly, especially if you are an older patient. This reduces the risk of dizzy or fainting spells. What side effects may I notice from receiving this medicine? Side effects that you should report to your doctor or health care professional as soon as possible: -allergic reactions like skin rash, itching or hives, swelling of the face, lips, or tongue -breathing problems -chest pain -confusion or hallucinations -irregular heartbeat -numbness of mouth or throat -seizures Side effects  that usually do not require medical attention (report to your doctor or health care professional if they continue or are bothersome): -burning feeling in the eyes -constipation -headache -nasal congestion -stomach upset This list may not describe all possible side effects. Call your doctor for medical advice about side effects. You may report side effects to FDA at 1-800-FDA-1088. Where should I keep my medicine? Keep out of the reach of children. Store at  room temperature between 15 and 30 degrees C (59 and 86 degrees F). Keep tightly closed. Protect from light and moisture. Throw away any unused medicine after the expiration date. NOTE: This sheet is a summary. It may not cover all possible information. If you have questions about this medicine, talk to your doctor, pharmacist, or health care provider.  2019 Elsevier/Gold Standard (2008-01-02 14:52:56) Fluconazole tablets What is this medicine? FLUCONAZOLE (floo KON na zole) is an antifungal medicine. It is used to treat certain kinds of fungal or yeast infections. This medicine may be used for other purposes; ask your health care provider or pharmacist if you have questions. COMMON BRAND NAME(S): Diflucan What should I tell my health care provider before I take this medicine? They need to know if you have any of these conditions: -history of irregular heart beat -kidney disease -an unusual or allergic reaction to fluconazole, other azole antifungals, medicines, foods, dyes, or preservatives -pregnant or trying to get pregnant -breast-feeding How should I use this medicine? Take this medicine by mouth. Follow the directions on the prescription label. Do not take your medicine more often than directed. Talk to your pediatrician regarding the use of this medicine in children. Special care may be needed. This medicine has been used in children as young as 87 months of age. Overdosage: If you think you have taken too much of this medicine  contact a poison control center or emergency room at once. NOTE: This medicine is only for you. Do not share this medicine with others. What if I miss a dose? If you miss a dose, take it as soon as you can. If it is almost time for your next dose, take only that dose. Do not take double or extra doses. What may interact with this medicine? Do not take this medicine with any of the following medications: -astemizole -certain medicines for irregular heart beat like dofetilide, dronedarone, quinidine -cisapride -erythromycin -lomitapide -other medicines that prolong the QT interval (cause an abnormal heart rhythm) -pimozide -terfenadine -thioridazine -tolvaptan -ziprasidone This medicine may also interact with the following medications: -antiviral medicines for HIV or AIDS -birth control pills -certain antibiotics like rifabutin, rifampin -certain medicines for blood pressure like amlodipine, isradipine, felodipine, hydrochlorothiazide, losartan, nifedipine -certain medicines for cancer like cyclophosphamide, vinblastine, vincristine -certain medicines for cholesterol like atorvastatin, lovastatin, fluvastatin, simvastatin -certain medicines for depression, anxiety, or psychotic disturbances like amitriptyline, midazolam, nortriptyline, triazolam -certain medicines for diabetes like glipizide, glyburide, tolbutamide -certain medicines for pain like alfentanil, fentanyl, methadone -certain medicines for seizures like carbamazepine, phenytoin -certain medicines that treat or prevent blood clots like warfarin -halofantrine -medicines that lower your chance of fighting infection like cyclosporine, prednisone, tacrolimus -NSAIDS, medicines for pain and inflammation, like celecoxib, diclofenac, flurbiprofen, ibuprofen, meloxicam, naproxen -other medicines for fungal infections -sirolimus -theophylline -tofacitinib This list may not describe all possible interactions. Give your health care  provider a list of all the medicines, herbs, non-prescription drugs, or dietary supplements you use. Also tell them if you smoke, drink alcohol, or use illegal drugs. Some items may interact with your medicine. What should I watch for while using this medicine? Visit your doctor or health care professional for regular checkups. If you are taking this medicine for a long time you may need blood work. Tell your doctor if your symptoms do not improve. Some fungal infections need many weeks or months of treatment to cure. Alcohol can increase possible damage to your liver. Avoid alcoholic drinks. If you have a vaginal infection,  do not have sex until you have finished your treatment. You can wear a sanitary napkin. Do not use tampons. Wear freshly washed cotton, not synthetic, panties. What side effects may I notice from receiving this medicine? Side effects that you should report to your doctor or health care professional as soon as possible: -allergic reactions like skin rash or itching, hives, swelling of the lips, mouth, tongue, or throat -dark urine -feeling dizzy or faint -irregular heartbeat or chest pain -redness, blistering, peeling or loosening of the skin, including inside the mouth -trouble breathing -unusual bruising or bleeding -vomiting -yellowing of the eyes or skin Side effects that usually do not require medical attention (report to your doctor or health care professional if they continue or are bothersome): -changes in how food tastes -diarrhea -headache -stomach upset or nausea This list may not describe all possible side effects. Call your doctor for medical advice about side effects. You may report side effects to FDA at 1-800-FDA-1088. Where should I keep my medicine? Keep out of the reach of children. Store at room temperature below 30 degrees C (86 degrees F). Throw away any medicine after the expiration date. NOTE: This sheet is a summary. It may not cover all possible  information. If you have questions about this medicine, talk to your doctor, pharmacist, or health care provider.  2019 Elsevier/Gold Standard (2013-05-11 19:37:38) Fluticasone nasal spray What is this medicine? FLUTICASONE (floo TIK a sone) is a corticosteroid. This medicine is used to treat the symptoms of allergies like sneezing, itchy red eyes, and itchy, runny, or stuffy nose. This medicine is also used to treat nasal polyps. This medicine may be used for other purposes; ask your health care provider or pharmacist if you have questions. COMMON BRAND NAME(S): ClariSpray, Flonase, Flonase Allergy Relief, Flonase Sensimist, Veramyst, XHANCE What should I tell my health care provider before I take this medicine? They need to know if you have any of these conditions: -eye disease, vision problems -infection, like tuberculosis, herpes, or fungal infection -recent surgery on nose or sinuses -taking a corticosteroid by mouth -an unusual or allergic reaction to fluticasone, steroids, other medicines, foods, dyes, or preservatives -pregnant or trying to get pregnant -breast-feeding How should I use this medicine? This medicine is for use in the nose. Follow the directions on your product or prescription label. This medicine works best if used at regular intervals. Do not use more often than directed. Make sure that you are using your nasal spray correctly. After 6 months of daily use for allergies, talk to your doctor or health care professional before using it for a longer time. Ask your doctor or health care professional if you have any questions. Talk to your pediatrician regarding the use of this medicine in children. Special care may be needed. Some products have been used for allergies in children as young as 2 years. After 2 months of daily use without a prescription in a child, talk to your pediatrician before using it for a longer time. Use of this medicine for nasal polyps is not approved in  children. Overdosage: If you think you have taken too much of this medicine contact a poison control center or emergency room at once. NOTE: This medicine is only for you. Do not share this medicine with others. What if I miss a dose? If you miss a dose, use it as soon as you remember. If it is almost time for your next dose, use only that dose and continue with your  regular schedule. Do not use double or extra doses. What may interact with this medicine? -certain antibiotics like clarithromycin and telithromycin -certain medicines for fungal infections like ketoconazole, itraconazole, and voriconazole -conivaptan -nefazodone -some medicines for HIV -vaccines This list may not describe all possible interactions. Give your health care provider a list of all the medicines, herbs, non-prescription drugs, or dietary supplements you use. Also tell them if you smoke, drink alcohol, or use illegal drugs. Some items may interact with your medicine. What should I watch for while using this medicine? Visit your healthcare professional for regular checks on your progress. Tell your healthcare professional if your symptoms do not start to get better or if they get worse. This medicine may increase your risk of getting an infection. Tell your doctor or health care professional if you are around anyone with measles or chickenpox, or if you develop sores or blisters that do not heal properly. What side effects may I notice from receiving this medicine? Side effects that you should report to your doctor or health care professional as soon as possible: -allergic reactions like skin rash, itching or hives, swelling of the face, lips, or tongue -changes in vision -crusting or sores in the nose -nosebleed -signs and symptoms of infection like fever or chills; cough; sore throat -white patches or sores in the mouth or nose Side effects that usually do not require medical attention (report to your doctor or health  care professional if they continue or are bothersome): -burning or irritation inside the nose or throat -changes in taste or smell -cough -headache This list may not describe all possible side effects. Call your doctor for medical advice about side effects. You may report side effects to FDA at 1-800-FDA-1088. Where should I keep my medicine? Keep out of the reach of children. Store at room temperature between 15 and 30 degrees C (59 and 86 degrees F). Avoid exposure to extreme heat, cold, or light. Throw away any unused medicine after the expiration date. NOTE: This sheet is a summary. It may not cover all possible information. If you have questions about this medicine, talk to your doctor, pharmacist, or health care provider.  2019 Elsevier/Gold Standard (2017-10-26 14:10:08) Amoxicillin; Clavulanic Acid tablets What is this medicine? AMOXICILLIN; CLAVULANIC ACID (a mox i SIL in; KLAV yoo lan ic AS id) is a penicillin antibiotic. It is used to treat certain kinds of bacterial infections. It will not work for colds, flu, or other viral infections. This medicine may be used for other purposes; ask your health care provider or pharmacist if you have questions. COMMON BRAND NAME(S): Augmentin What should I tell my health care provider before I take this medicine? They need to know if you have any of these conditions: -bowel disease, like colitis -kidney disease -liver disease -mononucleosis -an unusual or allergic reaction to amoxicillin, penicillin, cephalosporin, other antibiotics, clavulanic acid, other medicines, foods, dyes, or preservatives -pregnant or trying to get pregnant -breast-feeding How should I use this medicine? Take this medicine by mouth with a full glass of water. Follow the directions on the prescription label. Take at the start of a meal. Do not crush or chew. If the tablet has a score line, you may cut it in half at the score line for easier swallowing. Take your  medicine at regular intervals. Do not take your medicine more often than directed. Take all of your medicine as directed even if you think you are better. Do not skip doses or stop your  medicine early. Talk to your pediatrician regarding the use of this medicine in children. Special care may be needed. Overdosage: If you think you have taken too much of this medicine contact a poison control center or emergency room at once. NOTE: This medicine is only for you. Do not share this medicine with others. What if I miss a dose? If you miss a dose, take it as soon as you can. If it is almost time for your next dose, take only that dose. Do not take double or extra doses. What may interact with this medicine? -allopurinol -anticoagulants -birth control pills -methotrexate -probenecid This list may not describe all possible interactions. Give your health care provider a list of all the medicines, herbs, non-prescription drugs, or dietary supplements you use. Also tell them if you smoke, drink alcohol, or use illegal drugs. Some items may interact with your medicine. What should I watch for while using this medicine? Tell your doctor or health care professional if your symptoms do not improve. Do not treat diarrhea with over the counter products. Contact your doctor if you have diarrhea that lasts more than 2 days or if it is severe and watery. If you have diabetes, you may get a false-positive result for sugar in your urine. Check with your doctor or health care professional. Birth control pills may not work properly while you are taking this medicine. Talk to your doctor about using an extra method of birth control. What side effects may I notice from receiving this medicine? Side effects that you should report to your doctor or health care professional as soon as possible: -allergic reactions like skin rash, itching or hives, swelling of the face, lips, or tongue -breathing problems -dark urine  -fever or chills, sore throat -redness, blistering, peeling or loosening of the skin, including inside the mouth -seizures -trouble passing urine or change in the amount of urine -unusual bleeding, bruising -unusually weak or tired -white patches or sores in the mouth or throat Side effects that usually do not require medical attention (report to your doctor or health care professional if they continue or are bothersome): -diarrhea -dizziness -headache -nausea, vomiting -stomach upset -vaginal or anal irritation This list may not describe all possible side effects. Call your doctor for medical advice about side effects. You may report side effects to FDA at 1-800-FDA-1088. Where should I keep my medicine? Keep out of the reach of children. Store at room temperature below 25 degrees C (77 degrees F). Keep container tightly closed. Throw away any unused medicine after the expiration date. NOTE: This sheet is a summary. It may not cover all possible information. If you have questions about this medicine, talk to your doctor, pharmacist, or health care provider.  2019 Elsevier/Gold Standard (2007-12-27 12:04:30)   Coronavirus (COVID-19) Are you at risk?  Are you at risk for the Coronavirus (COVID-19)?  To be considered HIGH RISK for Coronavirus (COVID-19), you have to meet the following criteria:  . Traveled to [place], [place], [place], [place] or [place]; or in the [place] to [place], [place], [place], or [place]; and have fever, cough, and shortness of breath within the last 2 weeks of travel OR . Been in close contact with a person diagnosed with COVID-19 within the last 2 weeks and have fever, cough, and shortness of breath . IF YOU DO NOT MEET THESE CRITERIA, YOU ARE CONSIDERED LOW RISK FOR COVID-19.  What to do if you are HIGH RISK for COVID-19?  Marland Kitchen  If you are having a medical emergency, call 911. . Seek medical care right away. Before you go to a doctor's office,  urgent care or emergency department, call ahead and tell them about your recent travel, contact with someone diagnosed with COVID-19, and your symptoms. You should receive instructions from your physician's office regarding next steps of care.  . When you arrive at healthcare provider, tell the healthcare staff immediately you have returned from visiting [place], [place], [place], [place] or [place]; or traveled in the [place] to [place], [place], [place], or [place]; in the last two weeks or you have been in close contact with a person diagnosed with COVID-19 in the last 2 weeks.   . Tell the health care staff about your symptoms: fever, cough and shortness of breath. . After you have been seen by a medical provider, you will be either: o Tested for (COVID-19) and discharged home on quarantine except to seek medical care if symptoms worsen, and asked to  - Stay home and avoid contact with others until you get your results (4-5 days)  - Avoid travel on public transportation if possible (such as bus, train, or airplane) or o Sent to the Emergency Department by EMS for evaluation, COVID-19 testing, and possible admission depending on your condition and test results.  What to do if you are LOW RISK for COVID-19?  Reduce your risk of any infection by using the same precautions used for avoiding the common cold or flu:  Marland Kitchen Wash your hands often with soap and warm water for at least 20 seconds.  If soap and water are not readily available, use an alcohol-based hand sanitizer with at least 60% alcohol.  . If coughing or sneezing, cover your mouth and nose by coughing or sneezing into the elbow areas of your shirt or coat, into a tissue or into your sleeve (not your hands). . Avoid shaking hands with others and consider head nods or verbal greetings only. . Avoid touching your eyes, nose, or mouth with unwashed hands.  . Avoid close contact with people who are sick. . Avoid places or events with  large numbers of people in one location, like concerts or sporting events. . Carefully consider travel plans you have or are making. . If you are planning any travel outside or inside the [place], visit the CDC's Travelers' Health webpage for the latest health notices. . If you have some symptoms but not all symptoms, continue to monitor at home and seek medical attention if your symptoms worsen. . If you are having a medical emergency, call 911.   ADDITIONAL HEALTHCARE OPTIONS FOR PATIENTS  [facility] / e-Visit: [email or web address]         [facility]: [phone]  [facility]: [phone]                   [facility]: [phone]

## 2019-01-18 NOTE — Progress Notes (Addendum)
Virtual Visit via Telephone Note  I connected with Briana Howell on 01/18/19 at 12:30 PM EDT by telephone and verified that I am speaking with the correct person using two identifiers.   I discussed the limitations, risks, security and privacy concerns of performing an evaluation and management service by telephone and the availability of in person appointments. I also discussed with the patient that there may be a patient responsible charge related to this service. The patient expressed understanding and agreed to proceed.   History of Present Illness:  No Known Allergies  Patient is a 58 year old female in no acute distress who calls the office for a telephone visit.  She reports 7 days cough in the morning only, green mucous - yellow at times. Sinus drainage.    Patient  denies any fever, body aches,chills, rash, chest pain, shortness of breath, nausea, vomiting, or diarrhea.   No travel history -she lives in [place]. Retired.   Denies any known Covid-19 exposure. Denies any recent illness or exposure. No hospitalizations.   PCP [facility] - labs current and reviewed.    Observations/Objective: Patient is alert and oriented and responsive to questions Engages in eye contact with provider. Speaks in full sentences without any pauses without any shortness of breath or distress.    Assessment and Plan: Acute non-recurrent sinusitis, unspecified location  Seasonal allergies    Follow Up Instructions:   Discussed medications as below discussed. Also recommend taking daily Zyrtec.    Meds ordered this encounter  Medications  . fluconazole (DIFLUCAN) 150 MG tablet    Sig: Take 1 tablet (150 mg total) by mouth once for 1 dose.    Dispense:  1 tablet    Refill:  0  . benzonatate (TESSALON) 100 MG capsule    Sig: Take 1-2 capsules (100-200 mg total) by mouth 3 (three) times daily as needed for cough.    Dispense:  40 capsule    Refill:  0  .  amoxicillin-clavulanate (AUGMENTIN) 875-125 MG tablet    Sig: Take 1 tablet by mouth 2 (two) times daily.    Dispense:  20 tablet    Refill:  0  . fluticasone (FLONASE) 50 MCG/ACT nasal spray    Sig: Place 2 sprays into both nostrils daily.    Dispense:  16 g    Refill:  6    Discussed that with COVID- 19 still recommend self quarantining with any respiratory symptoms x 14 days.    Advised patient call the office or your primary care doctor for an appointment if no improvement within 72 hours or if any symptoms change or worsen at any time  Advised ER or urgent Care if after hours or on weekend. Call 911 for emergency symptoms at any time.Patinet verbalized understanding of all instructions given/reviewed and treatment plan and has no further questions or concerns at this time.     I discussed the assessment and treatment plan with the patient. The patient was provided an opportunity to ask questions and all were answered. The patient agreed with the plan and demonstrated an understanding of the instructions.   The patient was advised to call back or seek an in-person evaluation if the symptoms worsen or if the condition fails to improve as anticipated.  I provided 30 minutes of non-face-to-face time during this encounter.   Jairo Ben, FNP  History questionnaire submitted on Friday January 18, 2019 at 12:02:45 PM  Questionnaire: History  Patient: Briana  LUISE Howell [[id number]]    Medical History:   Question: Allergies  Response: Yes  Date: as a child  Comments:   Question: Depression  Response: No  Date:   Comments:   Question: Myocardial infarction  Response: No  Date:   Comments:   Question: Anemia  Response: No  Date:   Comments:   Question: Diabetes mellitus  Response: No  Date:   Comments:   Question: Nerve / muscle disease  Response: No  Date:   Comments:   Question: Anxiety  Response: Yes  Date: work related, now retired  Comments:    Question: Emphysema  Response: No  Date:   Comments:   Question: Brittle bones  Response: No  Date:   Comments:   Question: Arthritis  Response: No  Date:   Comments:   Question: Acid reflux  Response: No  Date:   Comments:   Question: Seizures  Response: No  Date:   Comments:   Question: Asthma  Response: Yes  Date:   Comments:   Question: Glaucoma  Response: No  Date:   Comments:   Question: Sickle cell anemia  Response: No  Date:   Comments:   Question: Blood transfusion  Response: No  Date:   Comments:   Question: Heart murmur  Response: No  Date:   Comments:   Question: Stroke  Response: No  Date:   Comments:   Question: Cancer  Response: No  Date:   Comments:   Question: HIV/AIDS  Response: No  Date:   Comments:   Question: Substance abuse  Response: No  Date:   Comments:   Question: Cataracts  Response: No  Date:   Comments:   Question: Hyperlipidemia  Response: No  Date:   Comments:   Question: Thyroid disease  Response: Yes  Date:   Comments:   Question: Bleeding problem  Response: No  Date:   Comments:   Question: High blood pressure  Response: Yes  Date:   Comments:   Question: Tuberculosis  Response: No  Date:   Comments:   Question: Congestive heart failure  Response: No  Date:   Comments:   Question: Kidney disease  Response: No  Date:   Comments:   Question: Ulcers  Response: No  Date:   Comments:   Question: COPD / chronic bronchitis  Response: No  Date:   Comments:   Question: History of Sleep Apnea  Response: No  Date:   Comments:   Question: On Home Oxygen Therapy  Response: No  Date:   Comments:     Surgical History:   Question: Appendectomy  Response: No  Date:   Comments:   Question: Plastic surgery  Response: No  Date:   Comments:   Question: Joint replacement  Response: No  Date:   Comments:   Question: Brain surgery  Response: No  Date:   Comments:   Question:  C-Section  Response: No  Date:   Comments:   Question: Small intestine surgery  Response: No  Date:   Comments:   Question: Breast surgery  Response: Yes  Date: 11/29/2016  Comments:   Question: Eye surgery  Response: No  Date:   Comments:   Question: Spine surgery  Response: No  Date:   Comments:   Question: Heart bypass  Response: No  Date:   Comments:   Question: Fracture surgery  Response: No  Date:   Comments:   Question: Tubes tied  Response: Yes  Date: 1990  Comments:  Question: Gall bladder removal  Response: No  Date:   Comments:   Question: Hernia repair  Response: No  Date:   Comments:   Question: Heart valve replacement  Response: No  Date:   Comments:   Question: Colon / large intestine surgery  Response: No  Date:   Comments:   Question: Hysterectomy  Response: Yes  Date: 1996  Comments:     Family History:   Problem: Anxiety disorder   Relation: Mother  Name: De Hollingshead  Comments:   Problem: Arthritis   Relation: Mother  Name: De Hollingshead  Comments:   Relation: Maternal Grandmother  Name: Margo Aye  Comments:   Problem: Cancer   Relation: Mother  Name: De Hollingshead  Comments:   Relation: Father  Name: Biagio Quint  Comments:   Relation: Maternal Grandmother  Name: Margo Aye  Comments:   Problem: COPD   Relation: Maternal Grandmother  Name: Margo Aye  Comments:   Problem: Depression   Relation: Mother  Name: De Hollingshead  Comments:   Problem: Diabetes   Relation: Father  Name: Biagio Quint  Comments:   Relation: Paternal Grandfather  Name: Haynes Hoehn  Comments:   Problem: Early death   Relation: Paternal Grandfather  Name: Haynes Hoehn  Comments:   Problem: Heart disease   Relation: Maternal Grandmother  Name: Margo Aye  Comments:   Problem: Hypertension   Relation: Mother  Name: De Hollingshead   Comments:   Relation: Maternal Grandmother  Name: Margo Aye  Comments:   Problem: Kidney disease   Relation: Father  Name: Biagio Quint  Comments:   Problem: Stroke   Relation: Father  Name: Biagio Quint  Comments:    Social History:   Question:  Response: Yes  Birth Control / Protection: Surgical  Comments:   Question:  Response: Never   Question: Alcohol Use  Response: Yes  Drinks/Week:  Comments: A few weekly   Question: Tobacco Use (if former smoker, must enter  quit date)  Response: Former Smoker  Types: Cigarettes  Packs/day: 0.25  Years: 3  Quit Date: 10/17/1993  Question: Are you ready to quit smoking?  Response: No Response  Comments:   Briana Howell, Briana Howell  to WardErskine Squibb, [facility]       01/18/19 11:54 AM  Patient Questionnaire Submission  --------------------------------   Questionnaire: Tommi Emery Virus Screening   Question: Do you have any of the following?  Answer:   Cough   Question: Do you have any of the following additional symptoms?  Answer:   None of these   Question: Have you had a fever?  Answer:   No   Question: Have others in your home or workplace had similar symptoms?  Answer:   No   Question: When did your symptoms start?  Answer:   A couple weeks ago when the pollen began.   Question: Have you recently visited any of the following countries?  Answer:   None of these   Question: If you have traveled anywhere in the last  2 months please document where you have visited:  Answer:   no   Question: Have you recently been around others from these countries or visited these countries who have had coughing or fever?  Answer:   No   Question: Have you recently been around anyone who has been diagnosed with Corona virus?  Answer:   No   Question:  Have you been taking any medications?  Answer:   Yes   Question: If taking medications for these symptoms, please list the names and whether they are helping or not   Answer:   Allergy meds over the counter.  No improvement.   Question: Are you treated for any of the following conditions: Asthma, COPD, Diabetes, Renal Failure (on Dialysis), AIDS, any Neuromuscular disease that effects the clearing of secretions, Heart Failure, or Heart Disease?  Answer:   No   Question: Please enter a phone number where you can be reached if we have additional questions about your symptoms  Answer:   [phone]   Question: Please list your medication allergies that you may have ? (If 'none' , please list as 'none')  Answer:   none   Question: Please list any additional comments  Answer:     Briana Howell, Briana Howell  to WardErskine Squibb, [facility]       01/18/19 11:51 AM  Patient Questionnaire Submission  --------------------------------   Questionnaire: Cough   Question: How long have you been coughing?  Answer:   7 or more days   Question: How would you describe the cough?  Answer:   A cough from a scratchy throat   Question: How often are you coughing?  Answer:   Infrequently but steadily   Question: Does the cough prevent you from sleeping at night?  Answer:   No   Question: What other symptoms have you experienced with the cough?  Answer:   None of the above   Question: Do you have a fever?  Answer:   No, I do not have a fever   Question: Are you coughing up any mucus?  Answer:   I am coughing up a little bit of mucus   Question: Do you use a maintenance inhaler?  Answer:   No   Question: Do you use a rescue inhaler (such as Ventolin?)  Answer:   No   Question: Have you previously required a prescription for prednisone for cough?  Answer:   Yes   Question: Are you diabetic?  Answer:   No   Questionnaire: Cough   Question: What is the appearance of the mucus?  Answer:   The mucus is thick   Questionnaire: Cough   Question: Do you have any of the following?  Answer:   None of the above   Question: Do you smoke?  Answer:   No   Questionnaire:  Cough   Question: Have you ever smoked?  Answer:   I smoked in the past, but quit   Questionnaire: Cough   Question: Are there people you know with similar symptoms?  Answer:   No   Question: Are you experiencing any of the following?  Answer:   Frequent heartburn   Question: Are you having difficulty breathing?  Answer:   No   Questionnaire: Cough   Question: Is your coughing worse when you are exposed to pollen, dust, or other things in the environment?  Answer:   Yes   Question: Have you been treated for a similar cough in the past?  Answer:   Yes   Questionnaire: Cough   Question: What treatments have worked in the past?  Answer:   Z packs usually when symptoms have not gotten to the "bronchitis" type of cold/cough.   Question: What treatment(s) in the past have been unsuccessful?  Answer:   Zpack if I let it get to be a constant cough and lots of  mucus.   Questionnaire: Cough   Question: Have you ever been diagnosed with asthma, bronchitis, or lung disease?  Answer:   Yes   Questionnaire: Cough   Question: Please enter a few details about your earlier diagnosis and treatment  Answer:   I have had usually 2 bouts of sinus or upper respiratory issues when the seasons change in spring and fall.  Z packs would start the healing process but I would usually need to come back or call and get something stronger.   Questionnaire: Cough   Question: Have you recently started on any medications for your heart or for blood pressure?  Answer:   No   Question: Have you recently been hospitalized?  Answer:   No   Questionnaire: Cough   Question: Please list your medication allergies that you may have ? (If 'none' , please list as 'none')  Answer:   none   Question: Please list any additional comments  Answer:   My symptoms are random coughing in the mornings for a couple hours.  I do not have fever, or coughing the rest of the day.  I cough up small thick greenish mucus.   The heartburn that I checked on the questionaire, but this only began a couple of months ago after I discontinued taking Omeprazole for helping me go to sleep.  I do not feel at all that it is related to this seasonal issue I get.   Ward, Erskine Squibb, CMA  to Briana Howell, Briana Howell       01/18/19 11:35 AM  Here are your evisit questions    Last read by Briana Howell at 12:06 PM on 01/18/2019.  This encounter is not signed. The conversation may still be ongoing.  [facility] E-Visit Cough   Question 01/18/2019 11:51 AM EDT - Filed by Patient on 01/18/2019  How long have you been coughing? 7 or more days  How would you describe the cough? A cough from a scratchy throat  How often are you coughing? Infrequently but steadily  Does the cough prevent you from sleeping at night? No  What other symptoms have you experienced with the cough? None of the above  Do you have a fever? No, I do not have a fever  Are you coughing up any mucus? I am coughing up a little bit of mucus  Do you use a maintenance inhaler? No  Do you use a rescue inhaler (such as Ventolin?) No  Have you previously required a prescription for prednisone for cough? Yes  Are you diabetic? No  What is the appearance of the mucus? The mucus is thick  Do you have any of the following? None of the above  Do you smoke? No  Have you ever smoked? I smoked in the past, but quit  Are there people you know with similar symptoms? No  Are you experiencing any of the following? Frequent heartburn  Are you having difficulty breathing? No  Is your coughing worse when you are exposed to pollen, dust, or other things in the environment? Yes  Have you been treated for a similar cough in the past? Yes  What treatments have worked in the past?  Z packs usually when symptoms have not gotten to the "bronchitis" type of cold/cough.  What treatment(s) in the past have been unsuccessful? Zpack if I let it get to be a constant cough and lots of mucus.  Have you  ever been diagnosed with asthma, bronchitis, or lung  disease? Yes  Please enter a few details about your earlier diagnosis and treatment I have had usually 2 bouts of sinus or upper respiratory issues when the seasons change in spring and fall.  Z packs would start the healing process but I would usually need to come back or call and get something stronger.  Have you recently started on any medications for your heart or for blood pressure? No  Have you recently been hospitalized? No  Please list your medication allergies that you may have ? (If 'none' , please list as 'none') none  Please list any additional comments  My symptoms are random coughing in the mornings for a couple hours.  I do not have fever, or coughing the rest of the day.  I cough up small thick greenish mucus.  The heartburn that I checked on the questionaire, but this only began a couple of months ago after I discontinued taking Omeprazole for helping me go to sleep.  I do not feel at all that it is related to this seasonal issue I get.  [facility] My[facility] Covid19 Screening   Question 01/18/2019 11:54 AM EDT - Filed by Patient on 01/18/2019  Do you have any of the following?  Cough  Do you have any of the following additional symptoms?  None of these  Have you had a fever? No  Have others in your home or workplace had similar symptoms? No  When did your symptoms start? A couple weeks ago when the pollen began.  Have you recently visited any of the following countries? None of these  If you have traveled anywhere in the last  2 months please document where you have visited: no  Have you recently been around others from these countries or visited these countries who have had coughing or fever? No  Have you recently been around anyone who has been diagnosed with Corona virus? No  Have you been taking any medications? Yes  If taking medications for these symptoms, please list the names and whether they are helping or not Allergy meds over the  counter.  No improvement.  Are you treated for any of the following conditions: Asthma, COPD, Diabetes, Renal Failure (on Dialysis), AIDS, any Neuromuscular disease that effects the clearing of secretions, Heart Failure, or Heart Disease? No  Please enter a phone number where you can be reached if we have additional questions about your symptoms [phone]  Please list your medication allergies that you may have ? (If 'none' , please list as 'none') none  Please list any additional comments

## 2019-02-06 NOTE — Addendum Note (Signed)
Addended by: Berniece Pap on: 02/06/2019 10:23 AM   Modules accepted: Level of Service

## 2019-02-27 IMAGING — US US BREAST*L* LIMITED INC AXILLA
1 series · 7 of 7 positions shown · non-contrast
Comparison: 11/29/2016 and prior study

CLINICAL DATA: 56-year-old female -followup left hip breast
asymmetry, biopsied demonstrating focal fibrosis and inflammation
changes without atypia or malignancy.

EXAM:
2D DIGITAL DIAGNOSTIC LEFT MAMMOGRAM WITH CAD AND ADJUNCT TOMO
ULTRASOUND LEFT BREAST

[Series 1: us breast*left* limited inc axilla · 0.06mm/px · 7 of 7 slices shown]
[im 1/7]
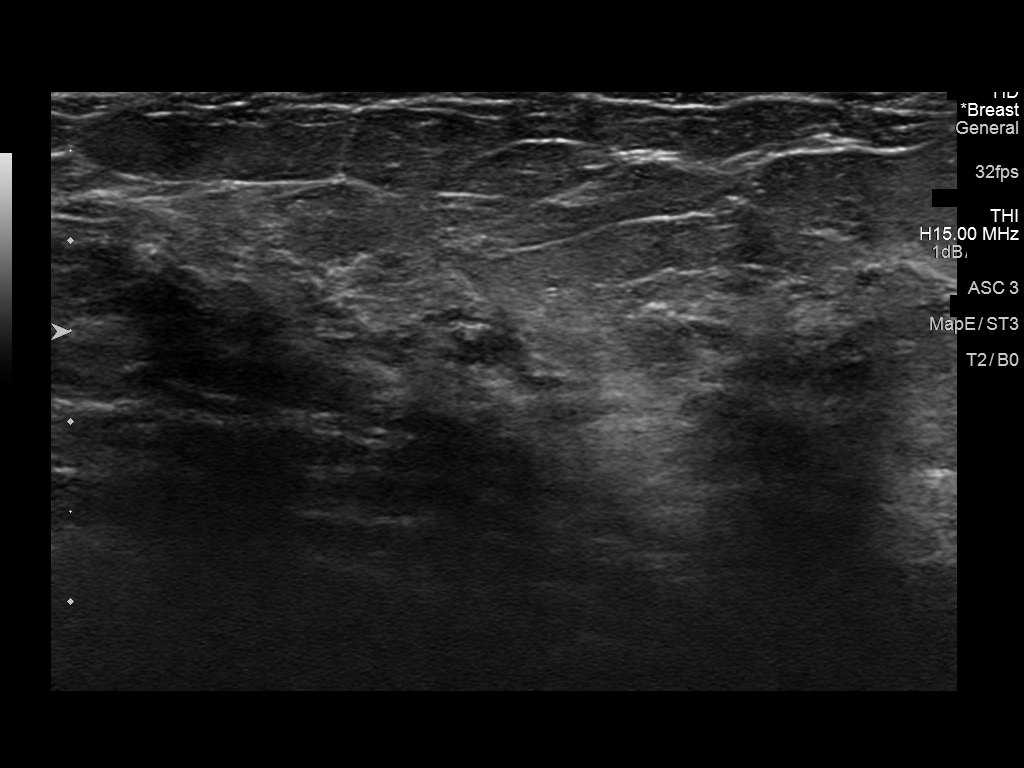
[im 2/7]
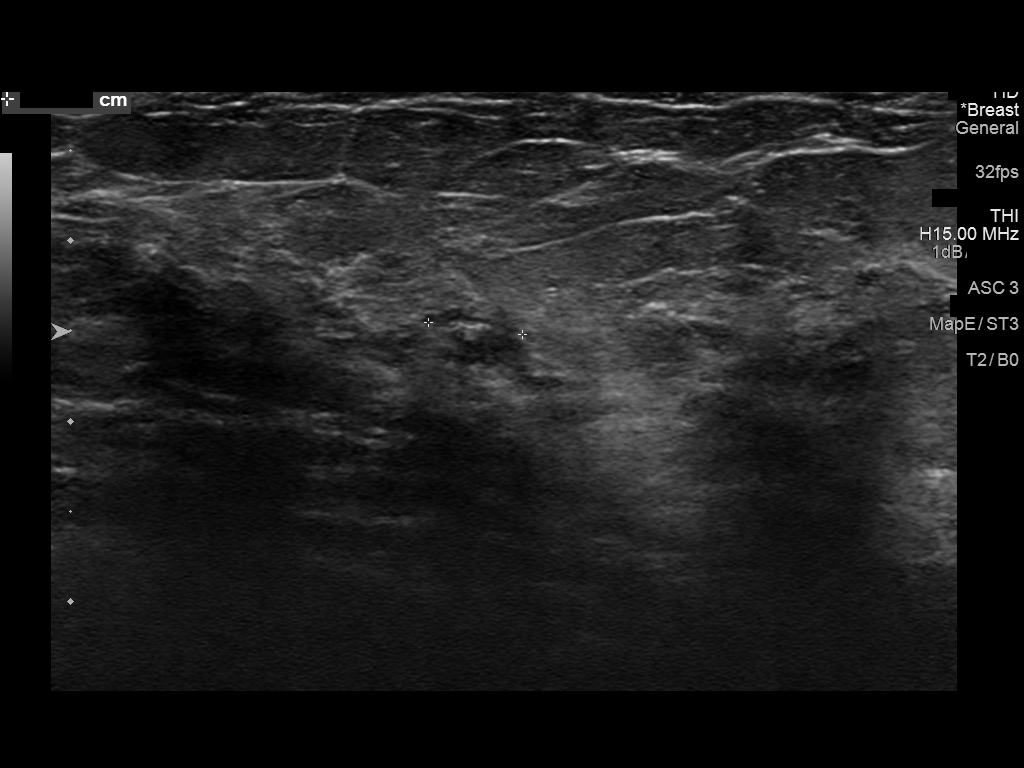
[im 3/7]
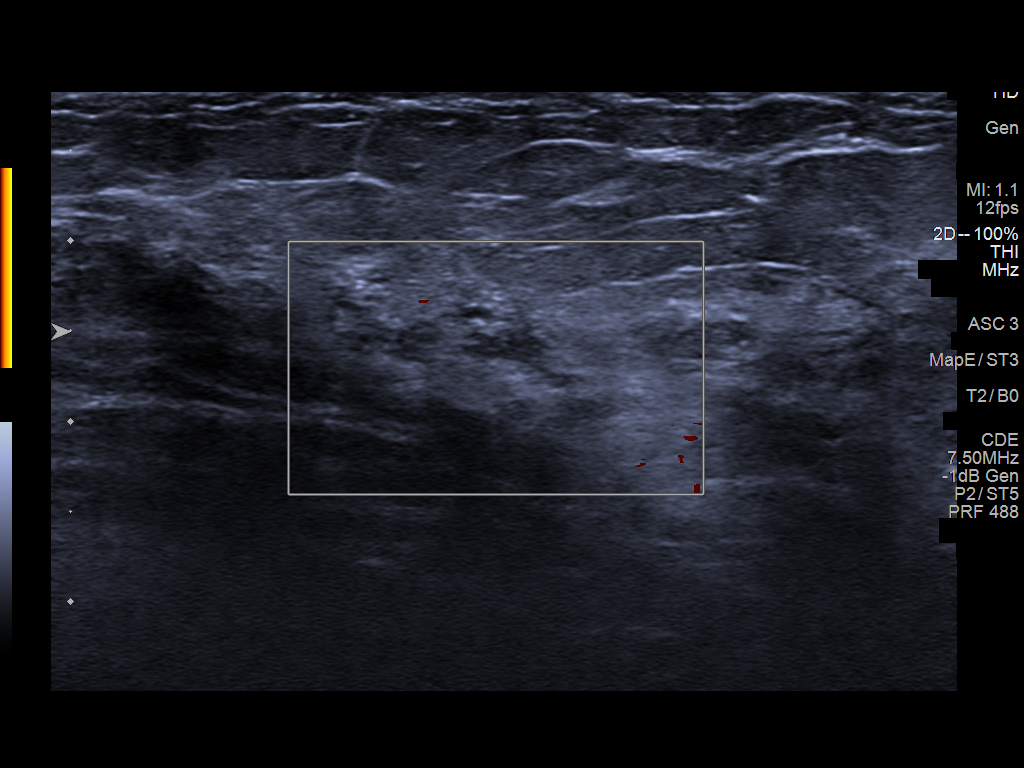
[im 4/7]
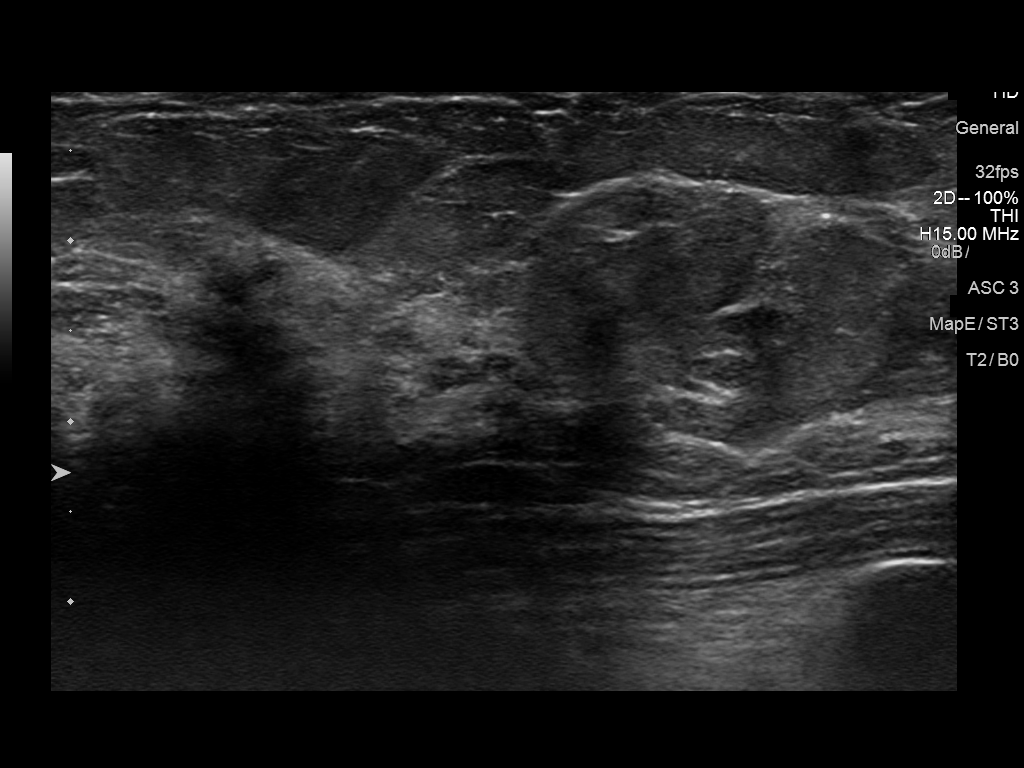
[im 5/7]
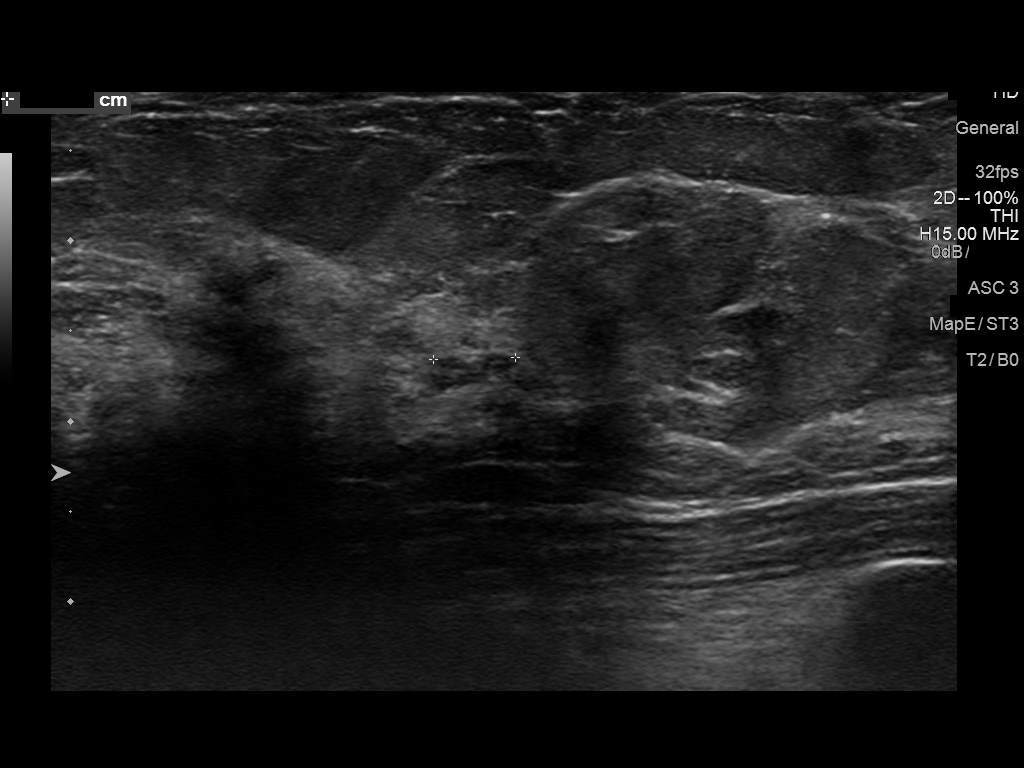
[im 6/7]
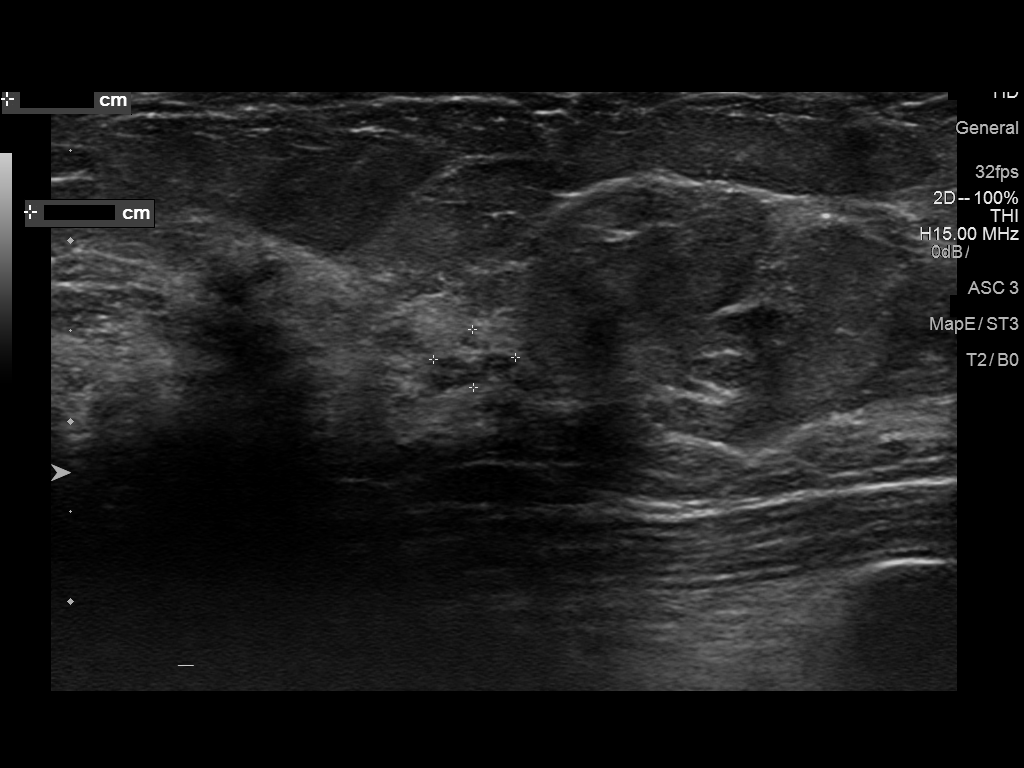
[im 7/7]
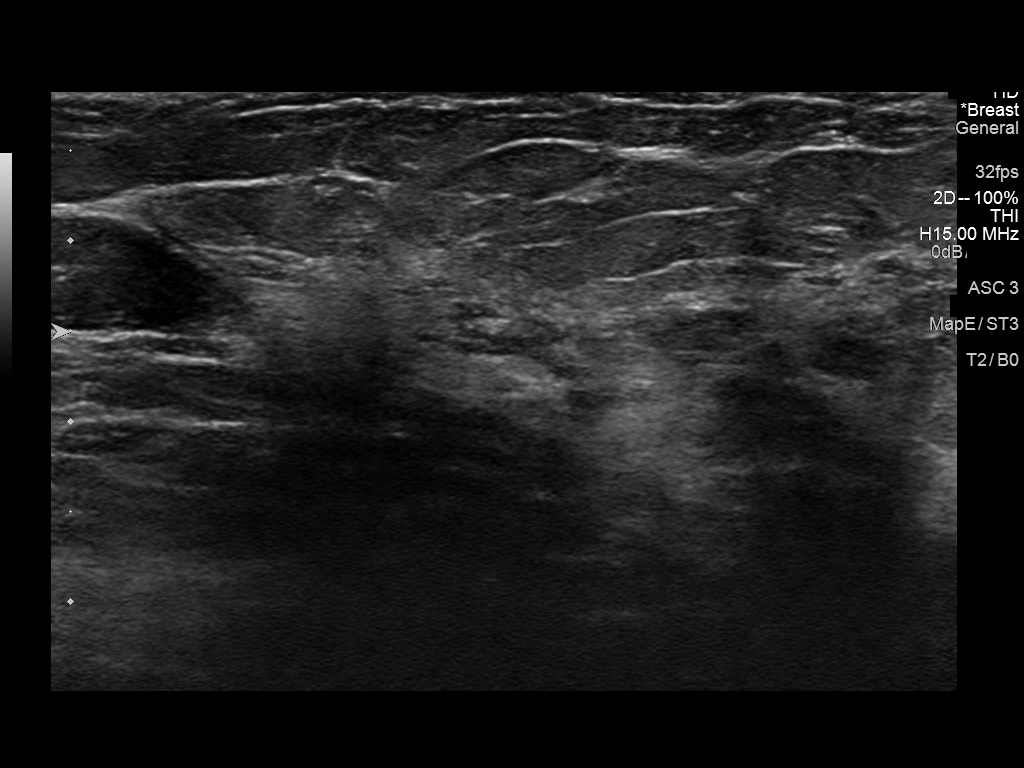

[7 of 7 positions shown; findings below may reference images not displayed]

ACR Breast Density Category c: The breast tissue is heterogeneously
dense, which may obscure small masses.
FINDINGS: 2D and 3D full field views of the left breast demonstrate a biopsy
clip within the inner left breast. The associated focal asymmetry is
less conspicuous on today's images.

No suspicious mass, non procedural distortion or worrisome
calcifications are noted.

Mammographic images were processed with CAD.

Targeted ultrasound is performed, showing a 4 x 3 x 5 mm ill-defined
hypoechoic area at the 10 o'clock position of the left breast 7 cm
from the nipple, containing biopsy clip. This area previously
measured 8 mm.
IMPRESSION: 1. Decreased size of hypoechoic area in the upper inner left breast,
compatible with biopsy-proven benign etiology.
2. No mammographic evidence of left breast malignancy.

RECOMMENDATION:
Bilateral screening mammograms in 2 months to resume annual
mammogram schedule.

I have discussed the findings and recommendations with the patient.
Results were also provided in writing at the conclusion of the
visit. If applicable, a reminder letter will be sent to the patient
regarding the next appointment.

BI-RADS CATEGORY  2: Benign.

## 2019-06-21 IMAGING — CR DG CHEST 2V
1 series · 2 of 2 positions shown · non-contrast
Comparison: 09/23/2013

CLINICAL DATA: Bronchitis [REDACTED], persistent cough

EXAM:
CHEST  2 VIEW

[Series 1: dg chest 2 view · 0.14mm/px · 2 of 2 slices shown]
[im 1/2]
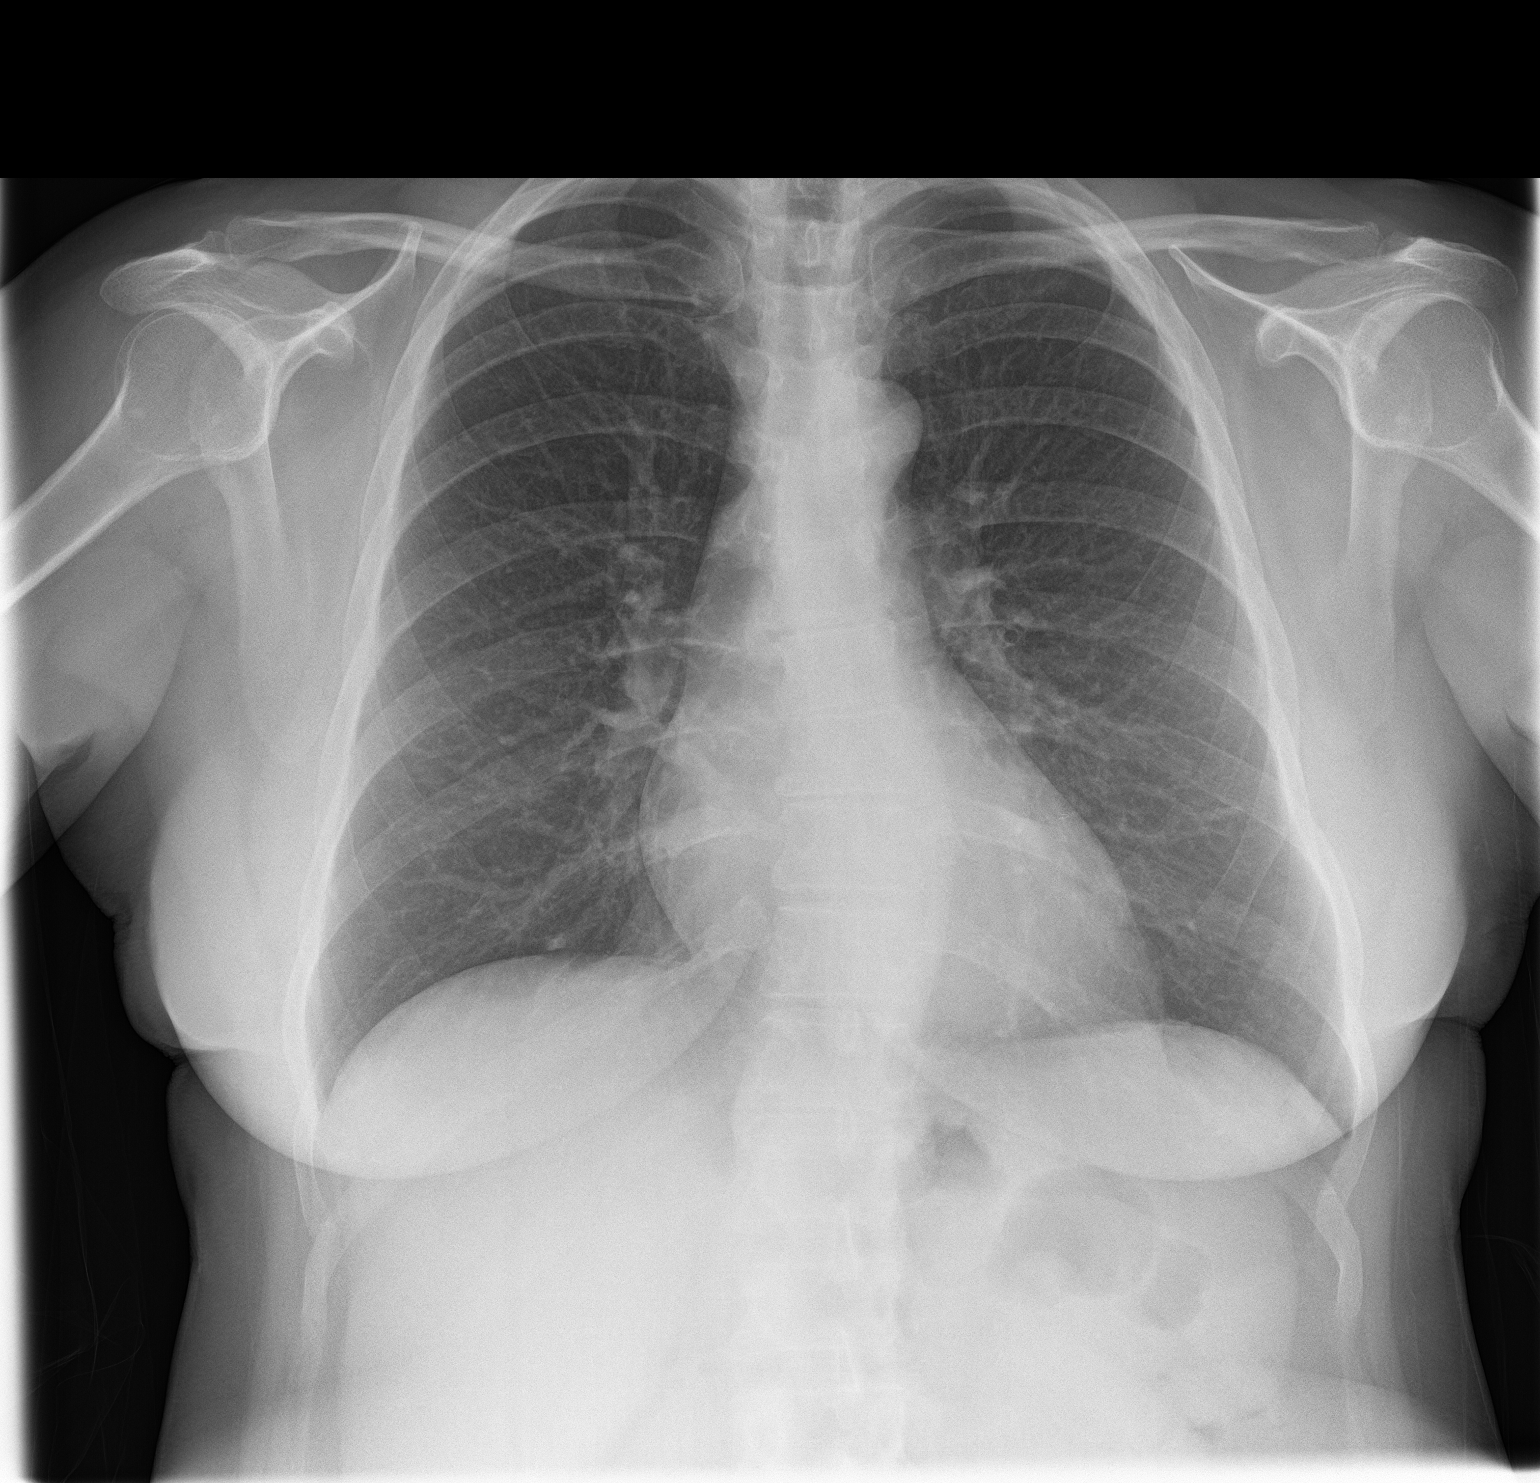
[im 2/2]
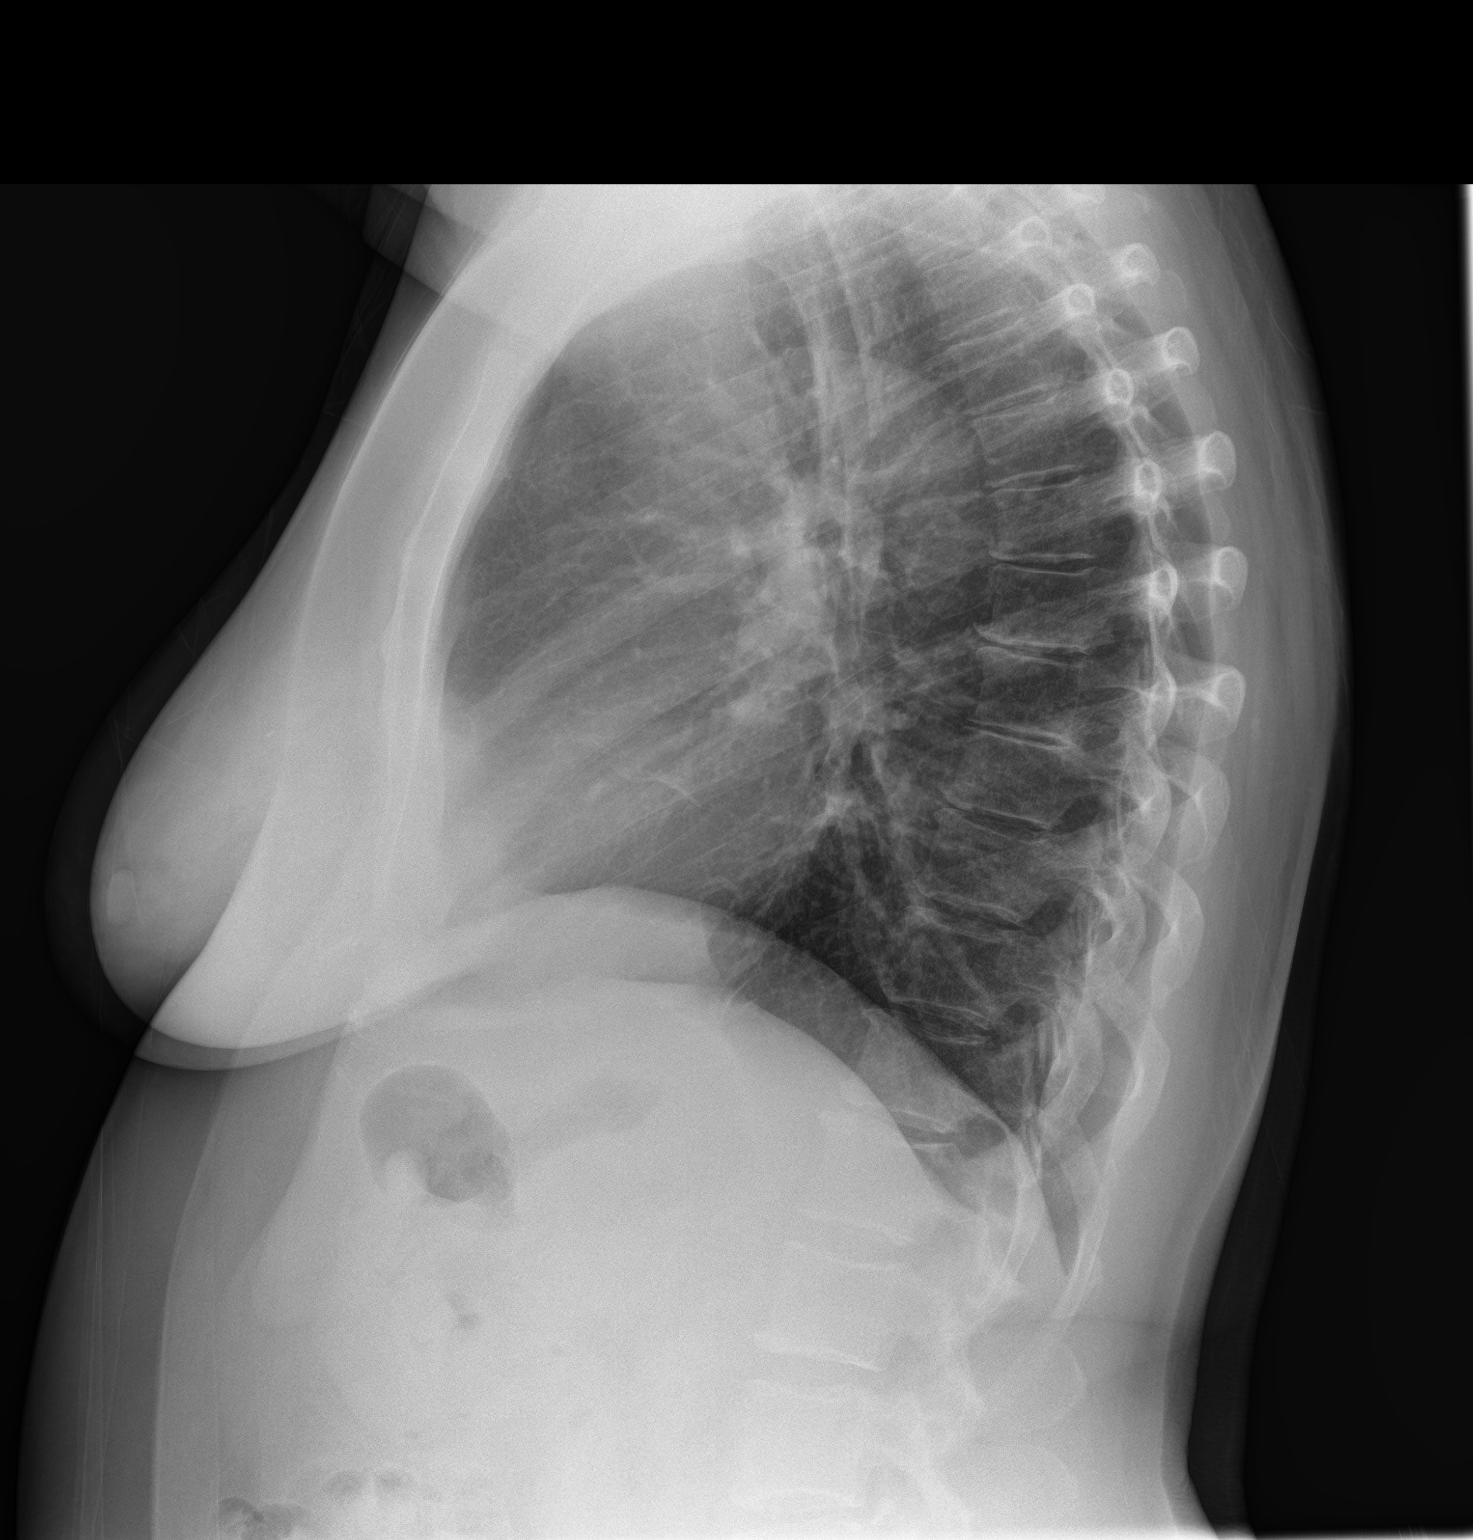

[2 of 2 positions shown; findings below may reference images not displayed]

FINDINGS: Normal heart size, mediastinal contours, and pulmonary vascularity.

Lungs clear.

No pleural effusion or pneumothorax.

Bones unremarkable.
IMPRESSION: No acute abnormalities.

## 2024-05-15 ENCOUNTER — Other Ambulatory Visit: Payer: Self-pay | Admitting: Medical Genetics

## 2024-08-09 ENCOUNTER — Other Ambulatory Visit: Payer: Self-pay | Admitting: Medical Genetics

## 2024-08-09 DIAGNOSIS — Z006 Encounter for examination for normal comparison and control in clinical research program: Secondary | ICD-10-CM
